# Patient Record
Sex: Male | Born: 1965 | Race: White | Hispanic: No | Marital: Single | Smoking: Never smoker
Health system: Southern US, Community
[De-identification: ages and names within clinical notes are randomized; demographics above are authoritative.]

## PROBLEM LIST (undated history)

## (undated) DIAGNOSIS — F909 Attention-deficit hyperactivity disorder, unspecified type: Secondary | ICD-10-CM

## (undated) DIAGNOSIS — F419 Anxiety disorder, unspecified: Secondary | ICD-10-CM

## (undated) DIAGNOSIS — L02419 Cutaneous abscess of limb, unspecified: Secondary | ICD-10-CM

## (undated) DIAGNOSIS — E291 Testicular hypofunction: Secondary | ICD-10-CM

## (undated) DIAGNOSIS — L989 Disorder of the skin and subcutaneous tissue, unspecified: Secondary | ICD-10-CM

## (undated) DIAGNOSIS — G4700 Insomnia, unspecified: Secondary | ICD-10-CM

## (undated) DIAGNOSIS — G4733 Obstructive sleep apnea (adult) (pediatric): Secondary | ICD-10-CM

## (undated) DIAGNOSIS — Z8279 Family history of other congenital malformations, deformations and chromosomal abnormalities: Secondary | ICD-10-CM

## (undated) DIAGNOSIS — R7301 Impaired fasting glucose: Secondary | ICD-10-CM

## (undated) DIAGNOSIS — I10 Essential (primary) hypertension: Secondary | ICD-10-CM

## (undated) DIAGNOSIS — F329 Major depressive disorder, single episode, unspecified: Secondary | ICD-10-CM

## (undated) DIAGNOSIS — E782 Mixed hyperlipidemia: Secondary | ICD-10-CM

## (undated) DIAGNOSIS — F32A Depression, unspecified: Secondary | ICD-10-CM

## (undated) DIAGNOSIS — L03119 Cellulitis of unspecified part of limb: Secondary | ICD-10-CM

## (undated) HISTORY — DX: Family history of other congenital malformations, deformations and chromosomal abnormalities: Z82.79

## (undated) HISTORY — DX: Obstructive sleep apnea (adult) (pediatric): G47.33

## (undated) HISTORY — DX: Disorder of the skin and subcutaneous tissue, unspecified: L98.9

## (undated) HISTORY — DX: Cutaneous abscess of limb, unspecified: L02.419

## (undated) HISTORY — DX: Impaired fasting glucose: R73.01

## (undated) HISTORY — DX: Cellulitis of unspecified part of limb: L03.119

## (undated) HISTORY — DX: Attention-deficit hyperactivity disorder, unspecified type: F90.9

## (undated) HISTORY — DX: Depression, unspecified: F32.A

## (undated) HISTORY — DX: Anxiety disorder, unspecified: F41.9

## (undated) HISTORY — DX: Insomnia, unspecified: G47.00

## (undated) HISTORY — PX: WISDOM TOOTH EXTRACTION: SHX21

## (undated) HISTORY — DX: Mixed hyperlipidemia: E78.2

## (undated) HISTORY — DX: Testicular hypofunction: E29.1

## (undated) HISTORY — DX: Major depressive disorder, single episode, unspecified: F32.9

---

## 2015-03-15 DIAGNOSIS — I10 Essential (primary) hypertension: Secondary | ICD-10-CM

## 2015-03-15 DIAGNOSIS — L02419 Cutaneous abscess of limb, unspecified: Secondary | ICD-10-CM

## 2015-03-15 HISTORY — DX: Essential (primary) hypertension: I10

## 2015-03-15 HISTORY — DX: Cutaneous abscess of limb, unspecified: L02.419

## 2015-03-31 ENCOUNTER — Emergency Department (HOSPITAL_BASED_OUTPATIENT_CLINIC_OR_DEPARTMENT_OTHER)
Admission: EM | Admit: 2015-03-31 | Discharge: 2015-03-31 | Disposition: A | Discharge status: Home / Self Care | Payer: BLUE CROSS/BLUE SHIELD | Attending: Emergency Medicine | Admitting: Emergency Medicine

## 2015-03-31 ENCOUNTER — Encounter (HOSPITAL_BASED_OUTPATIENT_CLINIC_OR_DEPARTMENT_OTHER): Payer: Self-pay | Admitting: *Deleted

## 2015-03-31 DIAGNOSIS — L02415 Cutaneous abscess of right lower limb: Secondary | ICD-10-CM

## 2015-03-31 DIAGNOSIS — I10 Essential (primary) hypertension: Secondary | ICD-10-CM | POA: Diagnosis not present

## 2015-03-31 DIAGNOSIS — Z792 Long term (current) use of antibiotics: Secondary | ICD-10-CM | POA: Diagnosis not present

## 2015-03-31 DIAGNOSIS — Z79899 Other long term (current) drug therapy: Secondary | ICD-10-CM | POA: Diagnosis not present

## 2015-03-31 DIAGNOSIS — L03115 Cellulitis of right lower limb: Secondary | ICD-10-CM | POA: Diagnosis present

## 2015-03-31 HISTORY — DX: Essential (primary) hypertension: I10

## 2015-03-31 MED ORDER — DOXYCYCLINE HYCLATE 100 MG PO CAPS: 100.0000 mg | ORAL_CAPSULE | Freq: Two times a day (BID) | ORAL | Status: DC

## 2015-03-31 MED ORDER — TRAMADOL HCL 50 MG PO TABS: 50.0000 mg | ORAL_TABLET | Freq: Four times a day (QID) | ORAL | Status: DC | PRN

## 2015-03-31 MED ORDER — DICLOFENAC SODIUM 50 MG PO TBEC
50.0000 mg | DELAYED_RELEASE_TABLET | Freq: Once | ORAL | Status: DC
  Filled 2015-03-31: qty 1

## 2015-03-31 MED ORDER — IBUPROFEN 400 MG PO TABS
600.0000 mg | ORAL_TABLET | Freq: Once | ORAL | Status: AC
  Administered 2015-03-31: 600 mg via ORAL
  Filled 2015-03-31: qty 1

## 2015-03-31 MED ORDER — LIDOCAINE-EPINEPHRINE 1 %-1:100000 IJ SOLN
20.0000 mL | Freq: Once | INTRAMUSCULAR | Status: AC
  Administered 2015-03-31: 20 mL via INTRADERMAL

## 2015-03-31 MED ORDER — DOXYCYCLINE HYCLATE 100 MG PO TABS
100.0000 mg | ORAL_TABLET | Freq: Once | ORAL | Status: AC
  Administered 2015-03-31: 100 mg via ORAL
  Filled 2015-03-31: qty 1

## 2015-03-31 MED ORDER — LIDOCAINE-EPINEPHRINE 1 %-1:100000 IJ SOLN
INTRAMUSCULAR | Status: AC
  Filled 2015-03-31: qty 1

## 2015-03-31 NOTE — Discharge Instructions (Signed)
Abscess Follow-up with a primary care provider regarding her blood pressure and recurrent abscess. Take doxycycline as prescribed. An abscess is an infected area that contains a collection of pus and debris.It can occur in almost any part of the body. An abscess is also known as a furuncle or boil. CAUSES  An abscess occurs when tissue gets infected. This can occur from blockage of oil or sweat glands, infection of hair follicles, or a minor injury to the skin. As the body tries to fight the infection, pus collects in the area and creates pressure under the skin. This pressure causes pain. People with weakened immune systems have difficulty fighting infections and get certain abscesses more often.  SYMPTOMS Usually an abscess develops on the skin and becomes a painful mass that is red, warm, and tender. If the abscess forms under the skin, you may feel a moveable soft area under the skin. Some abscesses break open (rupture) on their own, but most will continue to get worse without care. The infection can spread deeper into the body and eventually into the bloodstream, causing you to feel ill.  DIAGNOSIS  Your caregiver will take your medical history and perform a physical exam. A sample of fluid may also be taken from the abscess to determine what is causing your infection. TREATMENT  Your caregiver may prescribe antibiotic medicines to fight the infection. However, taking antibiotics alone usually does not cure an abscess. Your caregiver may need to make a small cut (incision) in the abscess to drain the pus. In some cases, gauze is packed into the abscess to reduce pain and to continue draining the area. HOME CARE INSTRUCTIONS   Only take over-the-counter or prescription medicines for pain, discomfort, or fever as directed by your caregiver.  If you were prescribed antibiotics, take them as directed. Finish them even if you start to feel better.  If gauze is used, follow your caregiver's  directions for changing the gauze.  To avoid spreading the infection:  Keep your draining abscess covered with a bandage.  Wash your hands well.  Do not share personal care items, towels, or whirlpools with others.  Avoid skin contact with others.  Keep your skin and clothes clean around the abscess.  Keep all follow-up appointments as directed by your caregiver. SEEK MEDICAL CARE IF:   You have increased pain, swelling, redness, fluid drainage, or bleeding.  You have muscle aches, chills, or a general ill feeling.  You have a fever. MAKE SURE YOU:   Understand these instructions.  Will watch your condition.  Will get help right away if you are not doing well or get worse.   This information is not intended to replace advice given to you by your health care provider. Make sure you discuss any questions you have with your health care provider.   Document Released: 02/07/2005 Document Revised: 10/30/2011 Document Reviewed: 07/13/2011 Elsevier Interactive Patient Education 2016 Elsevier Inc.  Incision and Drainage Incision and drainage is a procedure in which a sac-like structure (cystic structure) is opened and drained. The area to be drained usually contains material such as pus, fluid, or blood.  LET YOUR CAREGIVER KNOW ABOUT:   Allergies to medicine.  Medicines taken, including vitamins, herbs, eyedrops, over-the-counter medicines, and creams.  Use of steroids (by mouth or creams).  Previous problems with anesthetics or numbing medicines.  History of bleeding problems or blood clots.  Previous surgery.  Other health problems, including diabetes and kidney problems.  Possibility of pregnancy, if  this applies. RISKS AND COMPLICATIONS  Pain.  Bleeding.  Scarring.  Infection. BEFORE THE PROCEDURE  You may need to have an ultrasound or other imaging tests to see how large or deep your cystic structure is. Blood tests may also be used to determine if you  have an infection or how severe the infection is. You may need to have a tetanus shot. PROCEDURE  The affected area is cleaned with a cleaning fluid. The cyst area will then be numbed with a medicine (local anesthetic). A small incision will be made in the cystic structure. A syringe or catheter may be used to drain the contents of the cystic structure, or the contents may be squeezed out. The area will then be flushed with a cleansing solution. After cleansing the area, it is often gently packed with a gauze or another wound dressing. Once it is packed, it will be covered with gauze and tape or some other type of wound dressing. AFTER THE PROCEDURE   Often, you will be allowed to go home right after the procedure.  You may be given antibiotic medicine to prevent or heal an infection.  If the area was packed with gauze or some other wound dressing, you will likely need to come back in 1 to 2 days to get it removed.  The area should heal in about 14 days.   This information is not intended to replace advice given to you by your health care provider. Make sure you discuss any questions you have with your health care provider.   Document Released: 10/24/2000 Document Revised: 10/30/2011 Document Reviewed: 06/25/2011 Elsevier Interactive Patient Education [employer] Elsevier Inc.

## 2015-03-31 NOTE — ED Provider Notes (Signed)
CSN: [id number]     Arrival date & time 03/31/15  1651 History   First MD Initiated Contact with Patient 03/31/15 1704     Chief Complaint  Patient presents with  . Cellulitis     (Consider location/radiation/quality/duration/timing/severity/associated sxs/prior Treatment) The history is provided by the patient. No language interpreter was used.   Mr. Guy Garner is a 49 year old male with a history of recently diagnosed hypertension x1 week (on amlodipine) who presents for right leg abscess that began one month ago. He states he was seen at an urgent care and started on Bactrim for 7 days at that time. He states that the abscess resolved but that there was a knot there that he was concerned about so had the wound recheck. They stated it was not infected at that time. They noticed that his blood pressure was high again and started him on amlodipine. The knot on his right leg became infected again, and he marked the area and return to urgent care 4 days ago and they put him on Bactrim again for 10 days. He states that he has been applying warm compresses to the area. No previous history of abscesses.  He denies any fever, chills.  Past Medical History  Diagnosis Date  . Hypertension    History reviewed. No pertinent past surgical history. No family history on file. Social History  Substance Use Topics  . Smoking status: Never Smoker   . Smokeless tobacco: None  . Alcohol Use: No    Review of Systems  Constitutional: Negative for fever and chills.  Skin: Positive for wound.      Allergies  Review of patient's allergies indicates no known allergies.  Home Medications   Prior to Admission medications   Medication Sig Start Date End Date Taking? Authorizing Provider  amLODipine (NORVASC) 5 MG tablet Take 5 mg by mouth daily.   Yes Historical Provider, MD  sulfamethoxazole-trimethoprim (BACTRIM DS,SEPTRA DS) 800-160 MG tablet Take 1 tablet by mouth 2 (two) times daily.   Yes  Historical Provider, MD  doxycycline (VIBRAMYCIN) 100 MG capsule Take 1 capsule (100 mg total) by mouth 2 (two) times daily. 03/31/15   Keegan Bensch Patel-Mills, PA-C  traMADol (ULTRAM) 50 MG tablet Take 1 tablet (50 mg total) by mouth every 6 (six) hours as needed. 03/31/15   Ivah Girardot Patel-Mills, PA-C   BP 131/94 mmHg  Pulse 82  Temp(Src) 97.5 F (36.4 C) (Oral)  Resp 18  Ht  (1.854 m)  Wt 243 lb (110.224 kg)  BMI 32.07 kg/m2  SpO2 93% Physical Exam  Constitutional: He is oriented to person, place, and time. He appears well-developed and well-nourished.  HENT:  Head: Normocephalic and atraumatic.  Eyes: Conjunctivae are normal.  Neck: Neck supple.  Cardiovascular: Normal rate.   Pulmonary/Chest: Effort normal.  Musculoskeletal: Normal range of motion.  Neurological: He is alert and oriented to person, place, and time.  Skin: Skin is warm and dry.  Right lower extremity: Raised erythematous 3 cm round area above the mid tibia with 8 cm of surrounding erythema. The wound is warm, tender, and fluctuant with purulent yellow and clear drainage.  Psychiatric: He has a normal mood and affect.  Nursing note and vitals reviewed.   ED Course  Procedures (including critical care time) INCISION AND DRAINAGE Performed by: Catha Gosselin Consent: Verbal consent obtained. Risks and benefits: risks, benefits and alternatives were discussed Type: abscess Body area: right mid lower extremity above tibia Anesthesia: local infiltration Incision was made with a  scalpel. Local anesthetic: lidocaine 1% with epinephrine Anesthetic total: 2 ml Complexity: simple Blunt dissection to break up loculations Drainage: purulent Drainage amount: 3mL Packing material: 1/4 in iodoform gauze Patient tolerance: Patient tolerated the procedure well with no immediate complications. procedure was done in a bloodless field.    Labs Review Labs Reviewed  CULTURE, ROUTINE-ABSCESS    Imaging Review No  results found. I have personally reviewed and evaluated these lab results as part of my medical decision-making.   EKG Interpretation None      MDM   Final diagnoses:  Abscess of right lower leg  Patient presents for recurrent abscess above the mid right tibia. He is afebrile but hypertensive. He was recently started on amlodipine one week ago for high blood pressure. The abscess will need to be drained. He has been on Bactrim 2 for his leg wound which she states is not working. He also stated that he took doses as recommended and has been taking them for the past 4 days.  I will change his antibiotic to doxycycline. I discussed return precautions with the patient as well as follow-up. He verbally agrees with the plan. Medications  doxycycline (VIBRA-TABS) tablet 100 mg (100 mg Oral Given 03/31/15 1759)  lidocaine-EPINEPHrine (XYLOCAINE W/EPI) 1 %-1:100000 (with pres) injection 20 mL (20 mLs Intradermal Given 03/31/15 1746)  lidocaine-EPINEPHrine (XYLOCAINE W/EPI) 1 %-1:100000 (with pres) injection (  Duplicate 03/31/15 1746)  ibuprofen (ADVIL,MOTRIN) tablet 600 mg (600 mg Oral Given 03/31/15 1818)   Filed Vitals:   03/31/15 1838  BP: 131/94  Pulse: 82  Temp: 97.5 F (36.4 C)  Resp: [address]18       Adelyne Marchese Patel-Mills, PA-C 03/31/15 1919  Lyndal Pulleyaniel Knott, MD 03/31/15 1924

## 2015-03-31 NOTE — ED Notes (Signed)
Pt here for continued evaluation of cellulitis. Pt states has been treated at urgent care for several weeks on antibiotics without relief.

## 2015-04-01 NOTE — ED Notes (Signed)
Guy Garner called to state that he developed dizziness, nausea and dry heaves today after taking his second dose of Doxycycline.  Chart reviewed with Dr. Juleen ChinaKohut.  OK to stop the Doxycycline and restart the Bactrim that he was started on four days ago.  Encouraged to return if symptoms worsen, runs a fever or other concerns.  Patient verbalized understanding.

## 2015-04-03 LAB — CULTURE, ROUTINE-ABSCESS

## 2015-04-04 ENCOUNTER — Telehealth (HOSPITAL_BASED_OUTPATIENT_CLINIC_OR_DEPARTMENT_OTHER): Payer: Self-pay | Admitting: Emergency Medicine

## 2015-04-04 NOTE — Telephone Encounter (Signed)
Post ED Visit - Positive Culture Follow-up  Culture report reviewed by antimicrobial stewardship pharmacist:  []  Enzo BiNathan Batchelder, Pharm.D. []  Celedonio MiyamotoJeremy Frens, Pharm.D., BCPS []  Garvin FilaMike Maccia, Pharm.D. []  Georgina PillionElizabeth Martin, Pharm.D., BCPS []  [place]Minh Pham, [address]Pharm[address]D., BCPS, AAHIVP []  Estella HuskMichelle Turner, Pharm.D., BCPS, AAHIVP []  Tennis Mustassie Stewart, Pharm.D. [x]  Rob Oswaldo DoneVincent, [address]Pharm[address]  Positive abcess culture staph aureus Treated with bactrim DS and doxycycline, organism sensitive to the same and no further patient follow-up is required at this time.  Guy MullMiller, Guy Garner 04/04/2015, 11:49 AM

## 2015-04-10 ENCOUNTER — Encounter: Payer: Self-pay | Admitting: Family Medicine

## 2015-04-11 ENCOUNTER — Encounter: Payer: Self-pay | Admitting: Family Medicine

## 2015-04-11 ENCOUNTER — Ambulatory Visit (INDEPENDENT_AMBULATORY_CARE_PROVIDER_SITE_OTHER): Payer: BLUE CROSS/BLUE SHIELD | Admitting: Family Medicine

## 2015-04-11 VITALS — BP 138/96 | HR 112 | Temp 98.6°F | Resp 16 | Ht 71.75 in | Wt 247.0 lb

## 2015-04-11 DIAGNOSIS — Z23 Encounter for immunization: Secondary | ICD-10-CM | POA: Diagnosis not present

## 2015-04-11 DIAGNOSIS — L02419 Cutaneous abscess of limb, unspecified: Secondary | ICD-10-CM

## 2015-04-11 DIAGNOSIS — I10 Essential (primary) hypertension: Secondary | ICD-10-CM | POA: Diagnosis not present

## 2015-04-11 DIAGNOSIS — L03119 Cellulitis of unspecified part of limb: Secondary | ICD-10-CM | POA: Diagnosis not present

## 2015-04-11 MED ORDER — AMLODIPINE BESYLATE 10 MG PO TABS: 10.0000 mg | ORAL_TABLET | Freq: Every day | ORAL | Status: DC

## 2015-04-11 MED ORDER — CEPHALEXIN 500 MG PO CAPS: 500.0000 mg | ORAL_CAPSULE | Freq: Three times a day (TID) | ORAL | Status: DC

## 2015-04-11 NOTE — Progress Notes (Signed)
Pre visit review using our clinic review tool, if applicable. No additional management support is needed unless otherwise documented below in the visit note. 

## 2015-04-11 NOTE — Patient Instructions (Addendum)
Buy an upper arm bp cuff and check your bp and heart rate 1-2 times per day (quiet room x 5 min) and record these and return for review of them with me in 2 wks.  Normal bp is <140/90 but we'll be shooting for avg bp of <130 on top and <85 on bottom.  Avoid OTC decongestants (sudafed, phenylephrine).  Limit coffee to ??? Cups a day.   Change your dressing once daily (wet-to-dry gauze).

## 2015-04-11 NOTE — Progress Notes (Signed)
Office Note 04/11/2015  CC:  Chief Complaint  Patient presents with  . Establish Care  . Cellulitis    right leg, seen at ER     HPI:  Guy Garner is a 49 y.o. White male who is here to establish care and discuss elevated bp as well as recent R LL cellulitis w/small abscess recently.  Patient's most recent primary MD: [facility] [place] Old records in [facility] EMR and [facility] in [place] recently were reviewed prior to or during today's visit.  Has adult ADHD, [facility] Mar[facility]ue [facility]Amy [facility].   Has history of mild bp elevation since being put on adderall but admittedly he never had bp checked with any regularity prior to being on adderall.  Describes diet as being suboptimal and does not exercise regularly.  +FH HTN. Although he doesn't take sudafed regularly he did take a dose of short acting sudafed last night.   Was put on amlodipine about 2 wks ago for having elevated bp at the clinic visit when he was first evaluated for his leg cellulitis.  Has been having HA's lately frontal/temporal area.  Lots of stress lately plus brother had CVA recently.  Has no excessive urination or thirst.  Has had recent right tibia cellulitis/abscess; recently I&D's at [facility] 03/31/15: recently finished bactrim x total 7d course but this was an interrupted course.  The discomfort of his leg is gradually improving.  He did not tolerate a trial of doxycycline.  Past Medical History  Diagnosis Date  . Hypertension 03/2015  . Cellulitis and abscess of leg 03/2015    Right; I&D in ED 03/31/15 ([facility])  . Adult ADHD     Managed by Dr. Elisabeth MostStevenson    History reviewed. No pertinent past surgical history.  Family History  Problem Relation Age of Onset  . Hypothyroidism Mother   . Dementia Mother   . Heart attack Father   . Diabetes Father   . Hyperlipidemia Father   . Heart disease Father   . Hypertension Father   . Diabetes Brother   .  Stroke Brother   . Diabetes Maternal Grandmother   . Arthritis Paternal Grandmother   . Colon cancer Paternal Grandmother   . Stroke Paternal Grandfather     Social History   Social History  . Marital Status: Single    Spouse Name: N/A  . Number of Children: N/A  . Years of Education: N/A   Occupational History  . Not on file.   Social History Main Topics  . Smoking status: Never Smoker   . Smokeless tobacco: Never Used  . Alcohol Use: Yes  . Drug Use: No  . Sexual Activity: Not on file   Other Topics Concern  . Not on file   Social History Narrative   Divorced, 1 daughter.   Occupation: Hairdresser/stylist.   No T/A/Ds.    Outpatient Encounter Prescriptions as of 04/11/2015  Medication Sig  . ADDERALL XR 20 MG 24 hr capsule Take 1 tablet by mouth daily as needed.  . ADDERALL XR 30 MG 24 hr capsule Take 1 tablet by mouth daily.  Marland Kitchen. loratadine (CLARITIN) 10 MG tablet Take 10 mg by mouth daily.  . [DISCONTINUED] amLODipine (NORVASC) 5 MG tablet Take 5 mg by mouth daily.  Marland Kitchen. amLODipine (NORVASC) 10 MG tablet Take 1 tablet (10 mg total) by mouth daily.  . cephALEXin (KEFLEX) 500 MG capsule Take 1 capsule (500 mg total) by mouth  3 (three) times daily.  . [DISCONTINUED] doxycycline (VIBRAMYCIN) 100 MG capsule Take 1 capsule (100 mg total) by mouth 2 (two) times daily. (Patient not taking: Reported on 04/11/2015)  . [DISCONTINUED] sulfamethoxazole-trimethoprim (BACTRIM DS,SEPTRA DS) 800-160 MG tablet Take 1 tablet by mouth 2 (two) times daily.  . [DISCONTINUED] traMADol (ULTRAM) 50 MG tablet Take 1 tablet (50 mg total) by mouth every 6 (six) hours as needed. (Patient not taking: Reported on 04/11/2015)   No facility-administered encounter medications on file as of 04/11/2015.    Allergies  Allergen Reactions  . Doxycycline Nausea And Vomiting    ROS Review of Systems  Constitutional: Negative for fever and fatigue.  HENT: Negative for congestion and sore throat.    Eyes: Negative for visual disturbance.  Respiratory: Negative for cough.   Cardiovascular: Negative for chest pain.  Gastrointestinal: Negative for nausea and abdominal pain.  Genitourinary: Negative for dysuria.  Musculoskeletal: Negative for back pain and joint swelling.  Skin: Positive for wound (see HPI). Negative for rash.  Neurological: Positive for headaches (see hpi). Negative for tremors, weakness and light-headedness.  Hematological: Negative for adenopathy.    PE; Blood pressure 138/96, pulse 112, temperature 98.6 F (37 C), temperature source Oral, resp. rate 16, height 5' 11.75" (1.822 m), weight 247 lb (112.038 kg), SpO2 98 %. BP recheck 138/96 Gen: Alert, well appearing.  Patient is oriented to person, place, time, and situation. AFFECT: pleasant, lucid thought and speech. ZOX:WRUE: no injection, icteris, swelling, or exudate.  EOMI, PERRLA. Mouth: lips without lesion/swelling.  Oral mucosa pink and moist. Oropharynx without erythema, exudate, or swelling.  Neck: supple/nontender.  No LAD, mass, or TM.  Carotid pulses 2+ bilaterally, without bruits. CV: RRR, no m/r/g.   LUNGS: CTA bilat, nonlabored resps, good aeration in all lung fields. ABD: soft, NT/ND EXT: 1+ pitting edema bilat in LE's below knees (chronic per pt--PRIOR to taking amlodipine) SKIN: R pretibial region at about mid tibia level is an oval superficial ulceration at the site of his I&D, with greyish exudate in the wound bed, pink-hued skin surrounding wound.  No tenderness, erythema, active exudate, or streaking.    Pertinent labs:  03/27/16: cbc and bmet wnl--to be scanned into chart Wound clx 03/31/15 showed MSSA  ASSESSMENT AND PLAN:   New pt; obtain old records.  1) HTN; suspect he has had this prior to being on adderall, but I advised him to discuss alternative ADHD meds with Dr. Elisabeth Most.   Increase amlodipine to  qd. Instructions: Buy an upper arm bp cuff and check your bp and heart rate  1-2 times per day (quiet room x 5 min) and record these and return for review of them with me in 2 wks.  Normal bp is <140/90 but we'll be shooting for avg bp of <130 on top and <85 on bottom.  Avoid OTC decongestants (sudafed, phenylephrine).  2) R lower leg/pretibial abscess: s/p I&D, MSSA, cellulitis looks to have cleared. I want to give him another 7d of cephalexin since his antibiotic course was an "interrupted" one. Discussed daily wet-to-dry dressing changes, avoid antibiotic ointments now, monitor for changes, call for problems.  An After Visit Summary was printed and given to the patient.  Return in about 2 weeks (around 04/25/2015) for f/u HTN and leg wound.

## 2015-04-13 ENCOUNTER — Encounter: Payer: Self-pay | Admitting: Family Medicine

## 2015-04-14 ENCOUNTER — Other Ambulatory Visit: Payer: Self-pay | Admitting: Family Medicine

## 2015-04-14 ENCOUNTER — Encounter: Payer: Self-pay | Admitting: Family Medicine

## 2015-04-14 MED ORDER — AMLODIPINE BESY-BENAZEPRIL HCL 5-10 MG PO CAPS: 1.0000 | ORAL_CAPSULE | Freq: Every day | ORAL | Status: DC

## 2015-04-25 ENCOUNTER — Ambulatory Visit (INDEPENDENT_AMBULATORY_CARE_PROVIDER_SITE_OTHER): Payer: BLUE CROSS/BLUE SHIELD | Admitting: Family Medicine

## 2015-04-25 ENCOUNTER — Encounter: Payer: Self-pay | Admitting: Family Medicine

## 2015-04-25 VITALS — BP 142/86 | Temp 97.9°F | Ht 73.0 in | Wt 252.0 lb

## 2015-04-25 DIAGNOSIS — L03119 Cellulitis of unspecified part of limb: Secondary | ICD-10-CM | POA: Diagnosis not present

## 2015-04-25 DIAGNOSIS — L02419 Cutaneous abscess of limb, unspecified: Secondary | ICD-10-CM

## 2015-04-25 DIAGNOSIS — I10 Essential (primary) hypertension: Secondary | ICD-10-CM

## 2015-04-25 NOTE — Progress Notes (Signed)
OFFICE NOTE  04/25/2015  CC: No chief complaint on file.    HPI: Patient is a 49 y.o. Caucasian male who is here for 2 week f/u HTN and R LL pretibial cellulitis/abscess. Finished 7 d course of cephalexin that I gave him last visit. Since last visit I also changed him from 10mg  amlodipine to lotrel 5-10 qd.  Swelling in LE's not much of an issue at all. Home bp monitoring: avg 130s over 70s, HR avg 80. Leg feels great: no pain.  Just itching.  Has a dry scab and doesn't have to cover it anymore.  Goes to see ADHD MD today.  He will discuss non-stimulant options.   Pertinent PMH:  Past medical, surgical, social, and family history reviewed and no changes are noted since last office visit.  MEDS:  Outpatient Prescriptions Prior to Visit  Medication Sig Dispense Refill  . ADDERALL XR 20 MG 24 hr capsule Take 1 tablet by mouth daily as needed.  0  . ADDERALL XR 30 MG 24 hr capsule Take 1 tablet by mouth daily.  0  . amLODipine-benazepril (LOTREL) 5-10 MG capsule Take 1 capsule by mouth daily. 30 capsule 6  . loratadine (CLARITIN) 10 MG tablet Take 10 mg by mouth daily.    . cephALEXin (KEFLEX) 500 MG capsule Take 1 capsule (500 mg total) by mouth 3 (three) times daily. (Patient not taking: Reported on 04/25/2015) 21 capsule 0   No facility-administered medications prior to visit.    PE: Blood pressure 142/86, temperature 97.9 F (36.6 C), temperature source Oral, height 6\' 1"  (1.854 m), weight 252 lb (114.306 kg). Gen: Alert, well appearing.  Patient is oriented to person, place, time, and situation. CV: RRR, no m/r/g.   LUNGS: CTA bilat, nonlabored resps, good aeration in all lung fields. EXT: no clubbing or cyanosis.  1+ pitting edema both pretibial regions. R pretibial region with 2 cm oblong/oval scab without surrounding erythema, swelling, fluctuation, or tenderness.  IMPRESSION AND PLAN:  1) Essential HTN; good control now.  Continue current med. Exercise and diet  importance reiterated.  2) R pretibial cellulitis, recent abscess: healing well, no further abx. Signs/symptoms to call or return for were reviewed and pt expressed understanding.  An After Visit Summary was printed and given to the patient.   FOLLOW UP: 3 mo f/u HTN

## 2015-06-15 ENCOUNTER — Encounter: Payer: Self-pay | Admitting: Family Medicine

## 2015-06-15 ENCOUNTER — Other Ambulatory Visit: Payer: Self-pay | Admitting: Family Medicine

## 2015-06-15 MED ORDER — AMOXICILLIN 875 MG PO TABS: 875.0000 mg | ORAL_TABLET | Freq: Two times a day (BID) | ORAL | Status: AC

## 2015-06-15 NOTE — Telephone Encounter (Signed)
I left message on pts cell phone this morning advising him that he would need to be seen in office to be treated. Please advise. Thanks.

## 2015-06-15 NOTE — Telephone Encounter (Signed)
Please advise. Thanks.  

## 2015-07-13 DIAGNOSIS — R7301 Impaired fasting glucose: Secondary | ICD-10-CM

## 2015-07-13 DIAGNOSIS — E782 Mixed hyperlipidemia: Secondary | ICD-10-CM

## 2015-07-13 HISTORY — DX: Impaired fasting glucose: R73.01

## 2015-07-13 HISTORY — DX: Mixed hyperlipidemia: E78.2

## 2015-07-25 ENCOUNTER — Ambulatory Visit: Payer: BLUE CROSS/BLUE SHIELD | Admitting: Family Medicine

## 2015-08-01 ENCOUNTER — Ambulatory Visit (INDEPENDENT_AMBULATORY_CARE_PROVIDER_SITE_OTHER): Payer: BLUE CROSS/BLUE SHIELD | Admitting: Family Medicine

## 2015-08-01 ENCOUNTER — Encounter: Payer: Self-pay | Admitting: Family Medicine

## 2015-08-01 VITALS — BP 135/88 | HR 83 | Temp 97.5°F | Ht 73.0 in | Wt 250.8 lb

## 2015-08-01 DIAGNOSIS — I10 Essential (primary) hypertension: Secondary | ICD-10-CM

## 2015-08-01 DIAGNOSIS — R5382 Chronic fatigue, unspecified: Secondary | ICD-10-CM | POA: Diagnosis not present

## 2015-08-01 LAB — CBC WITH DIFFERENTIAL/PLATELET
BASOS ABS: 0 10*3/uL (ref 0.0–0.1)
BASOS PCT: 0.5 % (ref 0.0–3.0)
EOS ABS: 0.1 10*3/uL (ref 0.0–0.7)
Eosinophils Relative: 2.3 % (ref 0.0–5.0)
HEMATOCRIT: 48.2 % (ref 39.0–52.0)
HEMOGLOBIN: 16.5 g/dL (ref 13.0–17.0)
LYMPHS PCT: 28.3 % (ref 12.0–46.0)
Lymphs Abs: 1.6 10*3/uL (ref 0.7–4.0)
MCHC: 34.2 g/dL (ref 30.0–36.0)
MCV: 91.6 fl (ref 78.0–100.0)
Monocytes Absolute: 0.3 10*3/uL (ref 0.1–1.0)
Monocytes Relative: 6 % (ref 3.0–12.0)
NEUTROS ABS: 3.6 10*3/uL (ref 1.4–7.7)
Neutrophils Relative %: 62.9 % (ref 43.0–77.0)
PLATELETS: 263 10*3/uL (ref 150.0–400.0)
RBC: 5.26 Mil/uL (ref 4.22–5.81)
RDW: 13.2 % (ref 11.5–15.5)
WBC: 5.7 10*3/uL (ref 4.0–10.5)

## 2015-08-01 LAB — COMPREHENSIVE METABOLIC PANEL
ALBUMIN: 4.5 g/dL (ref 3.5–5.2)
ALK PHOS: 40 U/L (ref 39–117)
ALT: 28 U/L (ref 0–53)
AST: 14 U/L (ref 0–37)
BILIRUBIN TOTAL: 0.7 mg/dL (ref 0.2–1.2)
BUN: 10 mg/dL (ref 6–23)
CALCIUM: 9.9 mg/dL (ref 8.4–10.5)
CHLORIDE: 105 meq/L (ref 96–112)
CO2: 25 mEq/L (ref 19–32)
CREATININE: 0.87 mg/dL (ref 0.40–1.50)
GFR: 98.81 mL/min (ref >60.00)
Glucose, Bld: 105 mg/dL — ABNORMAL HIGH (ref 70–99)
Potassium: 4.1 mEq/L (ref 3.5–5.1)
SODIUM: 138 meq/L (ref 135–145)
TOTAL PROTEIN: 6.9 g/dL (ref 6.0–8.3)

## 2015-08-01 LAB — LIPID PANEL
CHOLESTEROL: 228 mg/dL — AB (ref 0–200)
HDL: 53.8 mg/dL (ref >39.00)
LDL CALC: 139 mg/dL — AB (ref 0–99)
NonHDL: 174.32
TRIGLYCERIDES: 179 mg/dL — AB (ref 0.0–149.0)
Total CHOL/HDL Ratio: 4
VLDL: 35.8 mg/dL (ref 0.0–40.0)

## 2015-08-01 LAB — TSH: TSH: 0.66 u[IU]/mL (ref 0.35–4.50)

## 2015-08-01 NOTE — Progress Notes (Signed)
OFFICE VISIT  08/01/2015   CC:  Chief Complaint  Patient presents with  . Hypertension   HPI:    Patient is a 50 y.o. Caucasian male who presents for 3 mo f/u HTN, also here for fasting labs. Home bp monitoring : 120s over 80s.  HR 75-80.  Says he is tired all the time x > 6 mo: gets 7-8 hours of sleep per night and it is restful sleep.  He snores.  No known hx of apneic spells in sleep.  Says he is more tired in body than he is "sleepy" tired. Has a lot of stress with life lately.  Appetite is good, no abnl wt loss.  He does not do any exercise but recalls feeling better when he did exercise regularly.  NO areas of pain.   No HA's.  He does have chronic mild nasal congestion that has recently been improved on flonase. Still with some PND and coughing, particularly in mornings.  No wheezing or ST.   Past Medical History  Diagnosis Date  . Hypertension 03/2015  . Cellulitis and abscess of leg 03/2015    Right; I&D in ED 03/31/15 (MSSA)  . Adult ADHD     Managed by Dr. Elisabeth MostStevenson  . Anxiety and depression     wellbutrin and citalopram in past  . Insomnia     ambien in past    No past surgical history on file.  Outpatient Prescriptions Prior to Visit  Medication Sig Dispense Refill  . ADDERALL XR 20 MG 24 hr capsule Take 1 tablet by mouth daily as needed.  0  . ADDERALL XR 30 MG 24 hr capsule Take 1 tablet by mouth daily.  0  . amLODipine-benazepril (LOTREL) 5-10 MG capsule Take 1 capsule by mouth daily. 30 capsule 6  . loratadine (CLARITIN) 10 MG tablet Take 10 mg by mouth daily.     No facility-administered medications prior to visit.    Allergies  Allergen Reactions  . Doxycycline Nausea And Vomiting    ROS As per HPI  PE: Blood pressure 135/88, pulse 83, temperature 97.5 F (36.4 C), temperature source Oral, height 6\' 1"  (1.854 m), weight 250 lb 12.8 oz (113.762 kg), SpO2 95 %. Gen: Alert, well appearing.  Patient is oriented to person, place, time, and  situation. ENT: Ears: EACs clear, normal epithelium.  TMs with good light reflex and landmarks bilaterally.  Eyes: no injection, icteris, swelling, or exudate.  EOMI, PERRLA. Nose: no drainage or turbinate edema/swelling.  No injection or focal lesion.  Mouth: lips without lesion/swelling.  Oral mucosa pink and moist.  Dentition intact and without obvious caries or gingival swelling.  Oropharynx without erythema, exudate, or swelling.  Neck - No masses or thyromegaly or limitation in range of motion CV: RRR, no m/r/g.   LUNGS: CTA bilat, nonlabored resps, good aeration in all lung fields. EXT: no clubbing, cyanosis, or edema.   LABS:  NONE  IMPRESSION AND PLAN:  1) Essential HTN; The current medical regimen is effective;  continue present plan and medications. Lytes/cr today. Screen for hyperlipidemia today, hepatic panel as well.  2) Chronic fatigue: likely life stress combined with lack of exercise. Will check CBC and TSH today.  An After Visit Summary was printed and given to the patient.  FOLLOW UP: Return in about 6 months (around 02/01/2016) for annual CPE (fasting).

## 2015-08-02 ENCOUNTER — Other Ambulatory Visit (INDEPENDENT_AMBULATORY_CARE_PROVIDER_SITE_OTHER): Payer: BLUE CROSS/BLUE SHIELD

## 2015-08-02 DIAGNOSIS — R7309 Other abnormal glucose: Secondary | ICD-10-CM | POA: Diagnosis not present

## 2015-08-02 LAB — HEMOGLOBIN A1C: HEMOGLOBIN A1C: 5.5 % (ref 4.6–6.5)

## 2015-08-03 ENCOUNTER — Encounter: Payer: Self-pay | Admitting: Family Medicine

## 2015-08-09 ENCOUNTER — Encounter: Payer: Self-pay | Admitting: Family Medicine

## 2015-08-10 NOTE — Telephone Encounter (Signed)
Please advise. Thanks.  

## 2015-11-21 ENCOUNTER — Other Ambulatory Visit: Payer: Self-pay | Admitting: Family Medicine

## 2015-11-21 MED ORDER — ENALAPRIL MALEATE 10 MG PO TABS: 10.0000 mg | ORAL_TABLET | Freq: Every day | ORAL | Status: DC

## 2015-11-28 ENCOUNTER — Other Ambulatory Visit: Payer: Self-pay | Admitting: Family Medicine

## 2016-01-09 DIAGNOSIS — F902 Attention-deficit hyperactivity disorder, combined type: Secondary | ICD-10-CM | POA: Diagnosis not present

## 2016-01-09 DIAGNOSIS — F419 Anxiety disorder, unspecified: Secondary | ICD-10-CM | POA: Diagnosis not present

## 2016-01-09 DIAGNOSIS — Z79899 Other long term (current) drug therapy: Secondary | ICD-10-CM | POA: Diagnosis not present

## 2016-01-09 DIAGNOSIS — I10 Essential (primary) hypertension: Secondary | ICD-10-CM | POA: Diagnosis not present

## 2016-06-26 DIAGNOSIS — I10 Essential (primary) hypertension: Secondary | ICD-10-CM | POA: Diagnosis not present

## 2016-06-26 DIAGNOSIS — F902 Attention-deficit hyperactivity disorder, combined type: Secondary | ICD-10-CM | POA: Diagnosis not present

## 2016-06-26 DIAGNOSIS — F419 Anxiety disorder, unspecified: Secondary | ICD-10-CM | POA: Diagnosis not present

## 2016-06-26 DIAGNOSIS — Z79899 Other long term (current) drug therapy: Secondary | ICD-10-CM | POA: Diagnosis not present

## 2016-07-09 ENCOUNTER — Other Ambulatory Visit: Payer: Self-pay | Admitting: Family Medicine

## 2016-07-09 DIAGNOSIS — L989 Disorder of the skin and subcutaneous tissue, unspecified: Secondary | ICD-10-CM

## 2016-07-12 DIAGNOSIS — L989 Disorder of the skin and subcutaneous tissue, unspecified: Secondary | ICD-10-CM

## 2016-07-12 HISTORY — DX: Disorder of the skin and subcutaneous tissue, unspecified: L98.9

## 2016-07-16 ENCOUNTER — Other Ambulatory Visit: Payer: Self-pay | Admitting: Family Medicine

## 2016-07-16 NOTE — Telephone Encounter (Signed)
[facility].  RF request for enalapril LOV: 08/01/15 Next ov: None Last written: 11/21/15 #30 w/ 6RF  Will send Rx for #30 w/ 0RF. Pt needs office visit for more refills. Mychart message sent.

## 2016-07-17 NOTE — Telephone Encounter (Signed)
Pt advised and voiced understanding.  Apt made for 08/06/16 at 3:30pm.

## 2016-07-17 NOTE — Telephone Encounter (Signed)
Left message for pt to call back  °

## 2016-07-30 DIAGNOSIS — L820 Inflamed seborrheic keratosis: Secondary | ICD-10-CM | POA: Diagnosis not present

## 2016-08-01 ENCOUNTER — Encounter: Payer: Self-pay | Admitting: Family Medicine

## 2016-08-06 ENCOUNTER — Ambulatory Visit (INDEPENDENT_AMBULATORY_CARE_PROVIDER_SITE_OTHER): Payer: BLUE CROSS/BLUE SHIELD | Admitting: Family Medicine

## 2016-08-06 ENCOUNTER — Encounter: Payer: Self-pay | Admitting: Family Medicine

## 2016-08-06 VITALS — BP 151/95 | HR 88 | Temp 97.9°F | Resp 16 | Ht 73.0 in | Wt 251.8 lb

## 2016-08-06 DIAGNOSIS — I10 Essential (primary) hypertension: Secondary | ICD-10-CM

## 2016-08-06 MED ORDER — ENALAPRIL MALEATE 10 MG PO TABS: ORAL_TABLET | ORAL | 7 refills | Status: DC

## 2016-08-06 NOTE — Progress Notes (Signed)
Pre visit review using our clinic review tool, if applicable. No additional management support is needed unless otherwise documented below in the visit note. 

## 2016-08-06 NOTE — Progress Notes (Signed)
OFFICE VISIT  08/06/2016   CC:  Chief Complaint  Patient presents with  . Follow-up    RCI,    HPI:    Patient is a 51 y.o. Caucasian male who presents for f/u HTN.  I last saw him 1 yr ago. Home bp monitoring: 130s/80s.  No cough, no LE swelling, no dizziness, no HAs. No low bp readings.  ADHD med recently changed to Mydayis by Dr. Elisabeth Most.   Past Medical History:  Diagnosis Date  . Adult ADHD    Managed by Dr. Elisabeth Most  . Anxiety and depression    wellbutrin and citalopram in past  . Cellulitis and abscess of leg 03/2015   Right; I&D in ED 03/31/15 (MSSA)  . Facial lesion 07/2016   irritated seb keratosis (Dr. Lupton)--cryotherapy performed.  . Hyperlipemia, mixed 07/2015   Mild (10 yr framingham risk was 4.5%)-no meds indicated.  . Hypertension 03/2015  . IFG (impaired fasting glucose) 07/2015   Gluc 105.  A1c was 5.5% at that time.  . Insomnia    ambien in past    History reviewed. No pertinent surgical history.  Outpatient Medications Prior to Visit  Medication Sig Dispense Refill  . fluticasone (FLONASE) 50 MCG/ACT nasal spray Place into both nostrils daily.    Marland Kitchen loratadine (CLARITIN) 10 MG tablet Take 10 mg by mouth daily.    Marland Kitchen PREVIDENT 5000 BOOSTER PLUS 1.1 % PSTE   3  . enalapril (VASOTEC) 10 MG tablet TAKE 1 TABLET (10 MG TOTAL) BY MOUTH DAILY. 30 tablet 0  . ADDERALL XR 20 MG 24 hr capsule Take 1 tablet by mouth daily as needed.  0  . ADDERALL XR 30 MG 24 hr capsule Take 1 tablet by mouth daily.  0   No facility-administered medications prior to visit.     Allergies  Allergen Reactions  . Doxycycline Nausea And Vomiting    ROS As per HPI  PE: Blood pressure (!) 151/95, pulse 88, temperature 97.9 F (36.6 C), temperature source Oral, resp. rate 16, height 6\' 1"  (1.854 m), weight 251 lb 12 oz (114.2 kg), SpO2 99 %. Gen: Alert, well appearing.  Patient is oriented to person, place, time, and situation. AFFECT: pleasant, lucid thought and  speech. CV: RRR, no m/r/g.   LUNGS: CTA bilat, nonlabored resps, good aeration in all lung fields. EXT: no clubbing, cyanosis, or edema.    LABS:  Lab Results  Component Value Date   TSH 0.66 08/01/2015   Lab Results  Component Value Date   WBC 5.7 08/01/2015   HGB 16.5 08/01/2015   HCT 48.2 08/01/2015   MCV 91.6 08/01/2015   PLT 263.0 08/01/2015   Lab Results  Component Value Date   CREATININE 0.87 08/01/2015   BUN 10 08/01/2015   NA 138 08/01/2015   K 4.1 08/01/2015   CL 105 08/01/2015   CO2 25 08/01/2015   Lab Results  Component Value Date   ALT 28 08/01/2015   AST 14 08/01/2015   ALKPHOS 40 08/01/2015   BILITOT 0.7 08/01/2015   Lab Results  Component Value Date   CHOL 228 (H) 08/01/2015   Lab Results  Component Value Date   HDL 53.80 08/01/2015   Lab Results  Component Value Date   LDLCALC 139 (H) 08/01/2015   Lab Results  Component Value Date   TRIG 179.0 (H) 08/01/2015   Lab Results  Component Value Date   CHOLHDL 4 08/01/2015   Lab Results  Component Value Date  HGBA1C 5.5 08/02/2015   IMPRESSION AND PLAN:  HTN: as per latest HTN management guidelines, his bp is still not ideally controlled. He does have a white coat component to his HTN. We discussed dose change today and he wanted to be careful with this and just go up by 5mg  enalapril at this time. He'll take 1 and 1/2 of the 10mg  enalapril once daily.  He'll continue home bp monitoring and let me know if bp not responding or other problem.  An After Visit Summary was printed and given to the patient.  FOLLOW UP: Return in about 6 months (around 02/06/2017) for annual CPE (fasting).  Signed:  Santiago BumpersPhil Donnis Phaneuf, MD           08/06/2016

## 2016-09-24 DIAGNOSIS — I10 Essential (primary) hypertension: Secondary | ICD-10-CM | POA: Diagnosis not present

## 2016-09-24 DIAGNOSIS — F902 Attention-deficit hyperactivity disorder, combined type: Secondary | ICD-10-CM | POA: Diagnosis not present

## 2016-09-24 DIAGNOSIS — F419 Anxiety disorder, unspecified: Secondary | ICD-10-CM | POA: Diagnosis not present

## 2016-09-24 DIAGNOSIS — Z79899 Other long term (current) drug therapy: Secondary | ICD-10-CM | POA: Diagnosis not present

## 2016-12-24 DIAGNOSIS — F902 Attention-deficit hyperactivity disorder, combined type: Secondary | ICD-10-CM | POA: Diagnosis not present

## 2016-12-24 DIAGNOSIS — F419 Anxiety disorder, unspecified: Secondary | ICD-10-CM | POA: Diagnosis not present

## 2016-12-24 DIAGNOSIS — Z79899 Other long term (current) drug therapy: Secondary | ICD-10-CM | POA: Diagnosis not present

## 2017-01-07 ENCOUNTER — Encounter: Payer: BLUE CROSS/BLUE SHIELD | Admitting: Family Medicine

## 2017-02-04 ENCOUNTER — Encounter: Payer: BLUE CROSS/BLUE SHIELD | Admitting: Family Medicine

## 2017-02-11 ENCOUNTER — Encounter: Payer: Self-pay | Admitting: Internal Medicine

## 2017-02-11 ENCOUNTER — Encounter: Payer: Self-pay | Admitting: Family Medicine

## 2017-02-11 ENCOUNTER — Ambulatory Visit (INDEPENDENT_AMBULATORY_CARE_PROVIDER_SITE_OTHER): Payer: BLUE CROSS/BLUE SHIELD | Admitting: Family Medicine

## 2017-02-11 VITALS — BP 129/81 | HR 77 | Temp 98.2°F | Resp 16 | Ht 72.0 in | Wt 257.5 lb

## 2017-02-11 DIAGNOSIS — G4733 Obstructive sleep apnea (adult) (pediatric): Secondary | ICD-10-CM

## 2017-02-11 DIAGNOSIS — R7301 Impaired fasting glucose: Secondary | ICD-10-CM | POA: Diagnosis not present

## 2017-02-11 DIAGNOSIS — Z1211 Encounter for screening for malignant neoplasm of colon: Secondary | ICD-10-CM

## 2017-02-11 DIAGNOSIS — Z125 Encounter for screening for malignant neoplasm of prostate: Secondary | ICD-10-CM | POA: Diagnosis not present

## 2017-02-11 DIAGNOSIS — Z23 Encounter for immunization: Secondary | ICD-10-CM

## 2017-02-11 DIAGNOSIS — Z0000 Encounter for general adult medical examination without abnormal findings: Secondary | ICD-10-CM | POA: Diagnosis not present

## 2017-02-11 HISTORY — DX: Obstructive sleep apnea (adult) (pediatric): G47.33

## 2017-02-11 LAB — CBC WITH DIFFERENTIAL/PLATELET
BASOS PCT: 0.8 % (ref 0.0–3.0)
Basophils Absolute: 0 10*3/uL (ref 0.0–0.1)
EOS ABS: 0.1 10*3/uL (ref 0.0–0.7)
EOS PCT: 2.1 % (ref 0.0–5.0)
HEMATOCRIT: 51.3 % (ref 39.0–52.0)
HEMOGLOBIN: 17.5 g/dL — AB (ref 13.0–17.0)
LYMPHS PCT: 34.3 % (ref 12.0–46.0)
Lymphs Abs: 1.9 10*3/uL (ref 0.7–4.0)
MCHC: 34 g/dL (ref 30.0–36.0)
MCV: 93.1 fl (ref 78.0–100.0)
MONOS PCT: 7.1 % (ref 3.0–12.0)
Monocytes Absolute: 0.4 10*3/uL (ref 0.1–1.0)
NEUTROS ABS: 3 10*3/uL (ref 1.4–7.7)
Neutrophils Relative %: 55.7 % (ref 43.0–77.0)
PLATELETS: 228 10*3/uL (ref 150.0–400.0)
RBC: 5.51 Mil/uL (ref 4.22–5.81)
RDW: 12.9 % (ref 11.5–15.5)
WBC: 5.4 10*3/uL (ref 4.0–10.5)

## 2017-02-11 LAB — LIPID PANEL
Cholesterol: 235 mg/dL — ABNORMAL HIGH (ref 0–200)
HDL: 54 mg/dL (ref >39.00)
LDL CALC: 144 mg/dL — AB (ref 0–99)
NONHDL: 181
Total CHOL/HDL Ratio: 4
Triglycerides: 184 mg/dL — ABNORMAL HIGH (ref 0.0–149.0)
VLDL: 36.8 mg/dL (ref 0.0–40.0)

## 2017-02-11 LAB — COMPREHENSIVE METABOLIC PANEL
ALBUMIN: 4.7 g/dL (ref 3.5–5.2)
ALK PHOS: 40 U/L (ref 39–117)
ALT: 32 U/L (ref 0–53)
AST: 14 U/L (ref 0–37)
BUN: 11 mg/dL (ref 6–23)
CHLORIDE: 102 meq/L (ref 96–112)
CO2: 28 mEq/L (ref 19–32)
Calcium: 9.9 mg/dL (ref 8.4–10.5)
Creatinine, Ser: 0.87 mg/dL (ref 0.40–1.50)
GFR: 98.2 mL/min (ref >60.00)
GLUCOSE: 106 mg/dL — AB (ref 70–99)
POTASSIUM: 3.9 meq/L (ref 3.5–5.1)
Sodium: 138 mEq/L (ref 135–145)
TOTAL PROTEIN: 6.9 g/dL (ref 6.0–8.3)
Total Bilirubin: 0.7 mg/dL (ref 0.2–1.2)

## 2017-02-11 LAB — HEMOGLOBIN A1C: HEMOGLOBIN A1C: 5.6 % (ref 4.6–6.5)

## 2017-02-11 LAB — PSA: PSA: 0.19 ng/mL (ref 0.10–4.00)

## 2017-02-11 LAB — TSH: TSH: 1.09 u[IU]/mL (ref 0.35–4.50)

## 2017-02-11 NOTE — Addendum Note (Signed)
Addended by: Smitty Knudsen on: 02/11/2017 10:30 AM   Modules accepted: Orders

## 2017-02-11 NOTE — Patient Instructions (Signed)

## 2017-02-11 NOTE — Progress Notes (Signed)
Office Note 02/11/2017  CC:  Chief Complaint  Patient presents with  . Annual Exam    Pt is not fasting.     HPI:  Guy Garner is a 51 y.o. White male who is here for annual health maintenance exam. Feeling fine, no acute complaints.  Eyes: about 1.5 yrs ago. Dental: UTD preventatives. Exercise: none right now. Diet: not working on anything.  Past Medical History:  Diagnosis Date  . Adult ADHD    Managed by Dr. Elisabeth Most  . Anxiety and depression    wellbutrin and citalopram in past  . Cellulitis and abscess of leg 03/2015   Right; I&D in ED 03/31/15 (MSSA)  . Facial lesion 07/2016   irritated seb keratosis (Dr. Lupton)--cryotherapy performed.  . Hyperlipemia, mixed 07/2015   Mild (10 yr framingham risk was 4.5%)-no meds indicated.  . Hypertension 03/2015  . IFG (impaired fasting glucose) 07/2015   Gluc 105.  A1c was 5.5% at that time.  . Insomnia    ambien in past    History reviewed. No pertinent surgical history.  Family History  Problem Relation Age of Onset  . Hypothyroidism Mother   . Dementia Mother   . Heart attack Father   . Diabetes Father   . Hyperlipidemia Father   . Heart disease Father   . Hypertension Father   . Diabetes Brother   . Stroke Brother   . Diabetes Maternal Grandmother   . Arthritis Paternal Grandmother   . Colon cancer Paternal Grandmother   . Stroke Paternal Grandfather     Social History   Social History  . Marital status: Single    Spouse name: N/A  . Number of children: N/A  . Years of education: N/A   Occupational History  . Not on file.   Social History Main Topics  . Smoking status: Never Smoker  . Smokeless tobacco: Never Used  . Alcohol use Yes  . Drug use: No  . Sexual activity: Not on file   Other Topics Concern  . Not on file   Social History Narrative   Divorced, 1 daughter.   Occupation: Hairdresser/stylist.   No T/A/Ds.    Outpatient Medications Prior to Visit  Medication Sig Dispense  Refill  . Amphet-Dextroamphet 3-Bead ER (MYDAYIS) 37.5 MG CP24 Take 1 capsule by mouth daily.    . enalapril (VASOTEC) 10 MG tablet 1.5 tabs po qd 45 tablet 7  . fluticasone (FLONASE) 50 MCG/ACT nasal spray Place into both nostrils daily.    Marland Kitchen PREVIDENT 5000 BOOSTER PLUS 1.1 % PSTE   3  . loratadine (CLARITIN) 10 MG tablet Take 10 mg by mouth daily.     No facility-administered medications prior to visit.     Allergies  Allergen Reactions  . Amlodipine Swelling    Lower legs swelling  . Doxycycline Nausea And Vomiting    ROS Review of Systems  Constitutional: Negative for appetite change, chills, fatigue and fever.  HENT: Negative for congestion, dental problem, ear pain and sore throat.   Eyes: Negative for discharge, redness and visual disturbance.  Respiratory: Negative for cough, chest tightness, shortness of breath and wheezing.   Cardiovascular: Negative for chest pain, palpitations and leg swelling.  Gastrointestinal: Negative for abdominal pain, blood in stool, diarrhea, nausea and vomiting.  Genitourinary: Negative for difficulty urinating, dysuria, flank pain, frequency, hematuria and urgency.  Musculoskeletal: Negative for arthralgias, back pain, joint swelling, myalgias and neck stiffness.  Skin: Negative for pallor and rash.  Neurological: Negative for  dizziness, speech difficulty, weakness and headaches.  Hematological: Negative for adenopathy. Does not bruise/bleed easily.  Psychiatric/Behavioral: Negative for confusion and sleep disturbance. The patient is not nervous/anxious.     PE; Blood pressure 129/81, pulse 77, temperature 98.2 F (36.8 C), temperature source Oral, resp. rate 16, height 6' (1.829 m), weight 257 lb 8 oz (116.8 kg), SpO2 97 %. Gen: Alert, well appearing.  Patient is oriented to person, place, time, and situation. AFFECT: pleasant, lucid thought and speech. ENT: Ears: EACs clear, normal epithelium.  TMs with good light reflex and landmarks  bilaterally.  Eyes: no injection, icteris, swelling, or exudate.  EOMI, PERRLA. Nose: no drainage or turbinate edema/swelling.  No injection or focal lesion.  Mouth: lips without lesion/swelling.  Oral mucosa pink and moist.  Dentition intact and without obvious caries or gingival swelling.  Oropharynx without erythema, exudate, or swelling.  Neck: supple/nontender.  No LAD, mass, or TM.  Carotid pulses 2+ bilaterally, without bruits. CV: RRR, no m/r/g.   LUNGS: CTA bilat, nonlabored resps, good aeration in all lung fields. ABD: soft, NT, ND, BS normal.  No hepatospenomegaly or mass.  No bruits. EXT: no clubbing, cyanosis, or edema.  Musculoskeletal: no joint swelling, erythema, warmth, or tenderness.  ROM of all joints intact. Skin - no sores or suspicious lesions or rashes or color changes Rectal exam: negative without mass, lesions or tenderness, PROSTATE EXAM: smooth and symmetric without nodules or tenderness.   Pertinent labs:  Lab Results  Component Value Date   TSH 0.66 08/01/2015   Lab Results  Component Value Date   WBC 5.7 08/01/2015   HGB 16.5 08/01/2015   HCT 48.2 08/01/2015   MCV 91.6 08/01/2015   PLT 263.0 08/01/2015   Lab Results  Component Value Date   CREATININE 0.87 08/01/2015   BUN 10 08/01/2015   NA 138 08/01/2015   K 4.1 08/01/2015   CL 105 08/01/2015   CO2 25 08/01/2015   Lab Results  Component Value Date   ALT 28 08/01/2015   AST 14 08/01/2015   ALKPHOS 40 08/01/2015   BILITOT 0.7 08/01/2015   Lab Results  Component Value Date   CHOL 228 (H) 08/01/2015   Lab Results  Component Value Date   HDL 53.80 08/01/2015   Lab Results  Component Value Date   LDLCALC 139 (H) 08/01/2015   Lab Results  Component Value Date   TRIG 179.0 (H) 08/01/2015   Lab Results  Component Value Date   CHOLHDL 4 08/01/2015    ASSESSMENT AND PLAN:   Health maintenance exam:  Reviewed age and gender appropriate health maintenance issues (prudent diet, regular  exercise, health risks of tobacco and excessive alcohol, use of seatbelts, fire alarms in home, use of sunscreen).  Also reviewed age and gender appropriate health screening as well as vaccine recommendations. Vaccines: UTD.  Flu vaccine today. Labs: fasting HP + A1c (hx of IFG). Prostate ca screening: DRE normal today , PSA today. Colon ca screening: referral today to GI for colonoscopy.    An After Visit Summary was printed and given to the patient.  FOLLOW UP:  Return in about 1 year (around 02/11/2018) for annual CPE (fasting).  Signed:  Santiago Bumpers, MD           02/11/2017

## 2017-02-12 ENCOUNTER — Encounter: Payer: Self-pay | Admitting: *Deleted

## 2017-02-27 ENCOUNTER — Encounter: Payer: Self-pay | Admitting: Family Medicine

## 2017-02-27 DIAGNOSIS — G4733 Obstructive sleep apnea (adult) (pediatric): Secondary | ICD-10-CM

## 2017-02-27 NOTE — Telephone Encounter (Signed)
[facility] ordered.-thx

## 2017-02-27 NOTE — Telephone Encounter (Signed)
Please advise. Thanks.  

## 2017-03-11 NOTE — Telephone Encounter (Signed)
Patient has not met deductible so he cannot afford the sleep study. He didn't realize it was going to be $3000. He will be getting different insurance next year so he may be able to afford it next year.

## 2017-04-08 ENCOUNTER — Telehealth: Payer: Self-pay | Admitting: Internal Medicine

## 2017-04-08 ENCOUNTER — Ambulatory Visit (AMBULATORY_SURGERY_CENTER): Payer: Self-pay | Admitting: *Deleted

## 2017-04-08 ENCOUNTER — Other Ambulatory Visit: Payer: Self-pay

## 2017-04-08 VITALS — Ht 73.0 in | Wt 254.0 lb

## 2017-04-08 DIAGNOSIS — Z1211 Encounter for screening for malignant neoplasm of colon: Secondary | ICD-10-CM

## 2017-04-08 DIAGNOSIS — F902 Attention-deficit hyperactivity disorder, combined type: Secondary | ICD-10-CM | POA: Diagnosis not present

## 2017-04-08 DIAGNOSIS — Z79899 Other long term (current) drug therapy: Secondary | ICD-10-CM | POA: Diagnosis not present

## 2017-04-08 MED ORDER — NA SULFATE-K SULFATE-MG SULF 17.5-3.13-1.6 GM/177ML PO SOLN: ORAL | 0 refills | Status: DC

## 2017-04-08 NOTE — Progress Notes (Signed)
Patient denies any allergies to eggs or soy. Patient denies any problems with anesthesia/sedation. Patient denies any oxygen use at home. Patient denies taking any diet/weight loss medications or blood thinners. EMMI education assisgned to patient on colonoscopy, this was explained and instructions given to patient. 

## 2017-04-08 NOTE — Telephone Encounter (Signed)
Coupon called into pharmacy for patient. Called patient and notified him.

## 2017-04-22 ENCOUNTER — Encounter: Payer: BLUE CROSS/BLUE SHIELD | Admitting: Internal Medicine

## 2017-05-23 ENCOUNTER — Other Ambulatory Visit: Payer: Self-pay | Admitting: Family Medicine

## 2017-05-28 ENCOUNTER — Encounter: Payer: Self-pay | Admitting: Family Medicine

## 2017-05-28 NOTE — Telephone Encounter (Signed)
I have printed out the information from the link pt mentions in his mychart message. I have put this on your desk. From what I understood, pt would like to have a home sleep test done and insurance needs a PA done for this. Please advise. Thanks.

## 2017-05-29 ENCOUNTER — Encounter: Payer: Self-pay | Admitting: Family Medicine

## 2017-05-30 NOTE — Telephone Encounter (Signed)
I don't do anything to do with sleep studies or sleep apnea---I refer all of it out to specialists. If he needs a referral to another sleep specialist I can do that, but I can't submit or authorize any orders for sleep studies, CPAP machines, or CPAP equipment etc for him.  Tell him sorry.-thx

## 2017-07-08 DIAGNOSIS — Z79899 Other long term (current) drug therapy: Secondary | ICD-10-CM | POA: Diagnosis not present

## 2017-07-08 DIAGNOSIS — F419 Anxiety disorder, unspecified: Secondary | ICD-10-CM | POA: Diagnosis not present

## 2017-07-08 DIAGNOSIS — I10 Essential (primary) hypertension: Secondary | ICD-10-CM | POA: Diagnosis not present

## 2017-07-08 DIAGNOSIS — F902 Attention-deficit hyperactivity disorder, combined type: Secondary | ICD-10-CM | POA: Diagnosis not present

## 2017-08-13 DIAGNOSIS — F4323 Adjustment disorder with mixed anxiety and depressed mood: Secondary | ICD-10-CM | POA: Diagnosis not present

## 2017-08-17 DIAGNOSIS — F4323 Adjustment disorder with mixed anxiety and depressed mood: Secondary | ICD-10-CM | POA: Diagnosis not present

## 2017-08-24 DIAGNOSIS — F4323 Adjustment disorder with mixed anxiety and depressed mood: Secondary | ICD-10-CM | POA: Diagnosis not present

## 2017-09-23 DIAGNOSIS — F4323 Adjustment disorder with mixed anxiety and depressed mood: Secondary | ICD-10-CM | POA: Diagnosis not present

## 2017-09-30 DIAGNOSIS — I10 Essential (primary) hypertension: Secondary | ICD-10-CM | POA: Diagnosis not present

## 2017-09-30 DIAGNOSIS — Z79899 Other long term (current) drug therapy: Secondary | ICD-10-CM | POA: Diagnosis not present

## 2017-09-30 DIAGNOSIS — F4323 Adjustment disorder with mixed anxiety and depressed mood: Secondary | ICD-10-CM | POA: Diagnosis not present

## 2017-09-30 DIAGNOSIS — F419 Anxiety disorder, unspecified: Secondary | ICD-10-CM | POA: Diagnosis not present

## 2017-09-30 DIAGNOSIS — F902 Attention-deficit hyperactivity disorder, combined type: Secondary | ICD-10-CM | POA: Diagnosis not present

## 2017-10-21 DIAGNOSIS — F4323 Adjustment disorder with mixed anxiety and depressed mood: Secondary | ICD-10-CM | POA: Diagnosis not present

## 2017-12-30 ENCOUNTER — Other Ambulatory Visit: Payer: Self-pay | Admitting: Family Medicine

## 2017-12-30 DIAGNOSIS — F419 Anxiety disorder, unspecified: Secondary | ICD-10-CM | POA: Diagnosis not present

## 2017-12-30 DIAGNOSIS — Z79899 Other long term (current) drug therapy: Secondary | ICD-10-CM | POA: Diagnosis not present

## 2017-12-30 DIAGNOSIS — F902 Attention-deficit hyperactivity disorder, combined type: Secondary | ICD-10-CM | POA: Diagnosis not present

## 2018-02-17 ENCOUNTER — Encounter: Payer: BLUE CROSS/BLUE SHIELD | Admitting: Family Medicine

## 2018-03-17 ENCOUNTER — Encounter: Payer: BLUE CROSS/BLUE SHIELD | Admitting: Family Medicine

## 2018-04-07 ENCOUNTER — Encounter: Payer: BLUE CROSS/BLUE SHIELD | Admitting: Family Medicine

## 2018-04-08 ENCOUNTER — Encounter: Payer: Self-pay | Admitting: *Deleted

## 2018-04-08 DIAGNOSIS — F419 Anxiety disorder, unspecified: Secondary | ICD-10-CM

## 2018-04-08 DIAGNOSIS — F329 Major depressive disorder, single episode, unspecified: Secondary | ICD-10-CM | POA: Insufficient documentation

## 2018-04-08 DIAGNOSIS — F32A Depression, unspecified: Secondary | ICD-10-CM | POA: Insufficient documentation

## 2018-04-14 ENCOUNTER — Encounter: Payer: BLUE CROSS/BLUE SHIELD | Admitting: Family Medicine

## 2018-04-21 ENCOUNTER — Ambulatory Visit (INDEPENDENT_AMBULATORY_CARE_PROVIDER_SITE_OTHER): Payer: BLUE CROSS/BLUE SHIELD | Admitting: Family Medicine

## 2018-04-21 ENCOUNTER — Encounter: Payer: Self-pay | Admitting: Family Medicine

## 2018-04-21 VITALS — BP 117/79 | HR 98 | Temp 97.9°F | Resp 16 | Ht 73.2 in | Wt 258.0 lb

## 2018-04-21 DIAGNOSIS — R7301 Impaired fasting glucose: Secondary | ICD-10-CM

## 2018-04-21 DIAGNOSIS — Z125 Encounter for screening for malignant neoplasm of prostate: Secondary | ICD-10-CM

## 2018-04-21 DIAGNOSIS — Z0000 Encounter for general adult medical examination without abnormal findings: Secondary | ICD-10-CM | POA: Diagnosis not present

## 2018-04-21 DIAGNOSIS — I10 Essential (primary) hypertension: Secondary | ICD-10-CM

## 2018-04-21 DIAGNOSIS — F902 Attention-deficit hyperactivity disorder, combined type: Secondary | ICD-10-CM | POA: Diagnosis not present

## 2018-04-21 DIAGNOSIS — Z23 Encounter for immunization: Secondary | ICD-10-CM

## 2018-04-21 DIAGNOSIS — Z79899 Other long term (current) drug therapy: Secondary | ICD-10-CM | POA: Diagnosis not present

## 2018-04-21 DIAGNOSIS — E669 Obesity, unspecified: Secondary | ICD-10-CM | POA: Insufficient documentation

## 2018-04-21 DIAGNOSIS — Z1211 Encounter for screening for malignant neoplasm of colon: Secondary | ICD-10-CM

## 2018-04-21 DIAGNOSIS — F419 Anxiety disorder, unspecified: Secondary | ICD-10-CM | POA: Diagnosis not present

## 2018-04-21 DIAGNOSIS — E66811 Obesity, class 1: Secondary | ICD-10-CM | POA: Insufficient documentation

## 2018-04-21 LAB — CBC WITH DIFFERENTIAL/PLATELET
BASOS ABS: 0 10*3/uL (ref 0.0–0.1)
Basophils Relative: 0.7 % (ref 0.0–3.0)
EOS ABS: 0.1 10*3/uL (ref 0.0–0.7)
Eosinophils Relative: 1.2 % (ref 0.0–5.0)
HCT: 50.1 % (ref 39.0–52.0)
HEMOGLOBIN: 17.1 g/dL — AB (ref 13.0–17.0)
Lymphocytes Relative: 26.3 % (ref 12.0–46.0)
Lymphs Abs: 1.8 10*3/uL (ref 0.7–4.0)
MCHC: 34 g/dL (ref 30.0–36.0)
MCV: 92.1 fl (ref 78.0–100.0)
MONO ABS: 0.5 10*3/uL (ref 0.1–1.0)
Monocytes Relative: 6.7 % (ref 3.0–12.0)
Neutro Abs: 4.5 10*3/uL (ref 1.4–7.7)
Neutrophils Relative %: 65.1 % (ref 43.0–77.0)
Platelets: 254 10*3/uL (ref 150.0–400.0)
RBC: 5.44 Mil/uL (ref 4.22–5.81)
RDW: 13.1 % (ref 11.5–15.5)
WBC: 6.9 10*3/uL (ref 4.0–10.5)

## 2018-04-21 LAB — COMPREHENSIVE METABOLIC PANEL
ALT: 29 U/L (ref 0–53)
AST: 16 U/L (ref 0–37)
Albumin: 4.8 g/dL (ref 3.5–5.2)
Alkaline Phosphatase: 42 U/L (ref 39–117)
BUN: 14 mg/dL (ref 6–23)
CO2: 28 mEq/L (ref 19–32)
Calcium: 9.6 mg/dL (ref 8.4–10.5)
Chloride: 102 mEq/L (ref 96–112)
Creatinine, Ser: 0.85 mg/dL (ref 0.40–1.50)
GFR: 100.4 mL/min (ref >60.00)
Glucose, Bld: 107 mg/dL — ABNORMAL HIGH (ref 70–99)
Potassium: 4.2 mEq/L (ref 3.5–5.1)
Sodium: 137 mEq/L (ref 135–145)
Total Bilirubin: 0.6 mg/dL (ref 0.2–1.2)
Total Protein: 7.1 g/dL (ref 6.0–8.3)

## 2018-04-21 LAB — HEMOGLOBIN A1C: HEMOGLOBIN A1C: 5.7 % (ref 4.6–6.5)

## 2018-04-21 LAB — TSH: TSH: 1.18 u[IU]/mL (ref 0.35–4.50)

## 2018-04-21 LAB — LIPID PANEL
Cholesterol: 240 mg/dL — ABNORMAL HIGH (ref 0–200)
HDL: 56.4 mg/dL (ref >39.00)
LDL Cholesterol: 145 mg/dL — ABNORMAL HIGH (ref 0–99)
NonHDL: 183.46
Total CHOL/HDL Ratio: 4
Triglycerides: 190 mg/dL — ABNORMAL HIGH (ref 0.0–149.0)
VLDL: 38 mg/dL (ref 0.0–40.0)

## 2018-04-21 LAB — PSA: PSA: 0.19 ng/mL (ref 0.10–4.00)

## 2018-04-21 MED ORDER — ENALAPRIL MALEATE 10 MG PO TABS: 15.0000 mg | ORAL_TABLET | Freq: Every day | ORAL | 3 refills | Status: DC
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
## 2018-04-21 NOTE — Patient Instructions (Signed)

## 2018-06-18 ENCOUNTER — Encounter: Payer: Self-pay | Admitting: Family Medicine

## 2018-07-04 ENCOUNTER — Encounter: Payer: Self-pay | Admitting: Family Medicine

## 2018-07-29 ENCOUNTER — Encounter: Payer: Self-pay | Admitting: Family Medicine

## 2018-08-12 DIAGNOSIS — F419 Anxiety disorder, unspecified: Secondary | ICD-10-CM | POA: Diagnosis not present

## 2018-08-12 DIAGNOSIS — F902 Attention-deficit hyperactivity disorder, combined type: Secondary | ICD-10-CM | POA: Diagnosis not present

## 2018-08-13 ENCOUNTER — Encounter: Payer: Self-pay | Admitting: Family Medicine

## 2018-10-20 ENCOUNTER — Ambulatory Visit: Payer: BLUE CROSS/BLUE SHIELD | Admitting: Family Medicine

## 2018-11-10 DIAGNOSIS — F902 Attention-deficit hyperactivity disorder, combined type: Secondary | ICD-10-CM | POA: Diagnosis not present

## 2018-11-10 DIAGNOSIS — I10 Essential (primary) hypertension: Secondary | ICD-10-CM | POA: Diagnosis not present

## 2018-11-10 DIAGNOSIS — Z79899 Other long term (current) drug therapy: Secondary | ICD-10-CM | POA: Diagnosis not present

## 2018-11-10 DIAGNOSIS — F419 Anxiety disorder, unspecified: Secondary | ICD-10-CM | POA: Diagnosis not present

## 2018-11-20 ENCOUNTER — Encounter: Payer: Self-pay | Admitting: Family Medicine

## 2018-11-20 NOTE — Telephone Encounter (Signed)
He can be put on my schedule 11/21/18. If my schedule is full then he can be worked in.

## 2018-11-21 ENCOUNTER — Telehealth: Payer: Self-pay

## 2018-11-21 ENCOUNTER — Ambulatory Visit: Payer: BC Managed Care – PPO

## 2018-11-21 ENCOUNTER — Ambulatory Visit (INDEPENDENT_AMBULATORY_CARE_PROVIDER_SITE_OTHER): Payer: BC Managed Care – PPO | Admitting: Family Medicine

## 2018-11-21 ENCOUNTER — Other Ambulatory Visit: Payer: Self-pay

## 2018-11-21 ENCOUNTER — Encounter: Payer: Self-pay | Admitting: Family Medicine

## 2018-11-21 VITALS — BP 136/88 | HR 74 | Temp 98.7°F | Resp 16 | Ht 73.2 in | Wt 259.2 lb

## 2018-11-21 DIAGNOSIS — I8001 Phlebitis and thrombophlebitis of superficial vessels of right lower extremity: Secondary | ICD-10-CM

## 2018-11-21 DIAGNOSIS — M79661 Pain in right lower leg: Secondary | ICD-10-CM

## 2018-11-21 DIAGNOSIS — L539 Erythematous condition, unspecified: Secondary | ICD-10-CM | POA: Diagnosis not present

## 2018-11-21 HISTORY — PX: OTHER SURGICAL HISTORY: SHX169

## 2018-11-21 MED ORDER — CEPHALEXIN 500 MG PO CAPS: 500.0000 mg | ORAL_CAPSULE | Freq: Three times a day (TID) | ORAL | 0 refills | Status: DC

## 2018-11-21 NOTE — Progress Notes (Signed)
OFFICE VISIT  11/21/2018   CC:  Chief Complaint  Patient presents with  . calf pain    right    HPI:    Patient is a 53 y.o. Caucasian male who presents for right calf pain. Onset of pain in R leg in medial area just inferior to knee joint.  Onset 2 nights ago and has spread down medial aspect of calf.  Feels a warmth, pulling, throbbing in the area.  Put ice on it, elevated it, took tylenol and this helped temporarily.  No recent surgical procedure, no prolonged immobilization, no prior hx of DVT or FH of DVT. No recent strain/injury. No fever, chills, malaise, cough, or SOB.  Past Medical History:  Diagnosis Date  . Adult ADHD    Managed by Dr. Johnnye Sima  . Anxiety and depression    wellbutrin and citalopram in past  . Cellulitis and abscess of leg 03/2015   Right; I&D in ED 03/31/15 ([facility])  . Facial lesion 07/2016   irritated seb keratosis (Dr. Lupton)--cryotherapy performed.  . Hyperlipemia, mixed 07/2015; 01/2017   Mild (10 yr framingham risk @ 5%)-no meds indicated.  . Hypertension 03/2015  . IFG (impaired fasting glucose) 07/2015; 01/2017   Low 100-110.  A1c 5.5-5.6%.  . Insomnia    ambien in past  . OSA (obstructive sleep apnea) 02/2017   Question of: pt sent my chart message describing classic sx's---referred to [facility] in [place] at that time (02/27/17).    Past Surgical History:  Procedure Laterality Date  . WISDOM TOOTH EXTRACTION      Outpatient Medications Prior to Visit  Medication Sig Dispense Refill  . Amphet-Dextroamphet 3-Bead ER (MYDAYIS) 37.5 MG CP24 Take 1 capsule by mouth daily.    Marland Kitchen aspirin EC 81 MG tablet Take 81 mg by mouth daily.    . citalopram (CELEXA) 20 MG tablet Take 1 tab by mouth daily.    . enalapril (VASOTEC) 10 MG tablet Take 1.5 tablets (15 mg total) by mouth daily. 135 tablet 3  . fluticasone (FLONASE) 50 MCG/ACT nasal spray Place into both nostrils daily.    Marland Kitchen ALPRAZolam (XANAX) 0.5 MG tablet Take 1 tablet  by mouth as needed.  0  . PREVIDENT 5000 BOOSTER PLUS 1.1 % PSTE   3   No facility-administered medications prior to visit.     Allergies  Allergen Reactions  . Amlodipine Swelling    Lower legs swelling  . Doxycycline Nausea And Vomiting    ROS As per HPI  PE: Blood pressure 136/88, pulse 74, temperature 98.7 F (37.1 C), temperature source Temporal, resp. rate 16, height 6' 1.2" (1.859 m), weight 259 lb 3.2 oz (117.6 kg), SpO2 97 %. Gen: Alert, well appearing.  Patient is oriented to person, place, time, and situation. AFFECT: pleasant, lucid thought and speech. R proximal/medial calf with focal area of induration but no nodule: redness begins at this area and extends in fusiform shape down to about mid calf medially.  Some TTP and warmth in the region is noted. 1-2+ pitting edema R LL. 1+ pitting edema L LL.   LABS:    Chemistry      Component Value Date/Time   NA 137 04/21/2018 1621   K 4.2 04/21/2018 1621   CL 102 04/21/2018 1621   CO2 28 04/21/2018 1621   BUN 14 04/21/2018 1621   CREATININE 0.85 04/21/2018 1621      Component Value Date/Time   CALCIUM 9.6 04/21/2018 1621   ALKPHOS  42 04/21/2018 1621   AST 16 04/21/2018 1621   ALT 29 04/21/2018 1621   BILITOT 0.6 04/21/2018 1621     Lab Results  Component Value Date   WBC 6.9 04/21/2018   HGB 17.1 (H) 04/21/2018   HCT 50.1 04/21/2018   MCV 92.1 04/21/2018   PLT 254.0 04/21/2018    IMPRESSION AND PLAN:  R calf pain with erythema. Suspect superficial phlebitis. However, he has had cellulitis in the area before and his area of erythema does extend out a bit more than what I would expect for simple superficial phlebitis. DVT less likely but will check LE venous doppler today.  Keflex 500mg  tid x 7d. Ice, elevation, NSAIDs discussed.  An After Visit Summary was printed and given to the patient.  FOLLOW UP: Return for to be determined based on result of eval.  Signed:  Santiago BumpersPhil McGowen, MD            11/21/2018

## 2018-11-21 NOTE — Telephone Encounter (Signed)
Voicemail was left with call report for pt's DVT study, it was negative for DVT in R leg.

## 2018-11-21 NOTE — Telephone Encounter (Signed)
Patient advised, expressed understanding.  

## 2018-11-21 NOTE — Telephone Encounter (Signed)
Great. Pls call patient and reassure him that he DOES NOT have a blood clot in leg. Continue with plan as discussed at his office visit today.-thx

## 2018-11-21 NOTE — Addendum Note (Signed)
Addended by: Tammi Sou on: 11/21/2018 10:16 AM   Modules accepted: Orders

## 2018-11-21 NOTE — Telephone Encounter (Signed)
Pt added on schedule for today.

## 2018-11-24 ENCOUNTER — Encounter: Payer: Self-pay | Admitting: Family Medicine

## 2018-11-28 ENCOUNTER — Encounter: Payer: Self-pay | Admitting: Family Medicine

## 2019-03-03 DIAGNOSIS — Z79899 Other long term (current) drug therapy: Secondary | ICD-10-CM | POA: Diagnosis not present

## 2019-03-03 DIAGNOSIS — F902 Attention-deficit hyperactivity disorder, combined type: Secondary | ICD-10-CM | POA: Diagnosis not present

## 2019-03-03 DIAGNOSIS — F419 Anxiety disorder, unspecified: Secondary | ICD-10-CM | POA: Diagnosis not present

## 2019-06-08 DIAGNOSIS — F419 Anxiety disorder, unspecified: Secondary | ICD-10-CM | POA: Diagnosis not present

## 2019-06-08 DIAGNOSIS — F902 Attention-deficit hyperactivity disorder, combined type: Secondary | ICD-10-CM | POA: Diagnosis not present

## 2019-06-08 DIAGNOSIS — Z79899 Other long term (current) drug therapy: Secondary | ICD-10-CM | POA: Diagnosis not present

## 2019-07-13 ENCOUNTER — Encounter: Payer: Self-pay | Admitting: Family Medicine

## 2019-07-13 ENCOUNTER — Other Ambulatory Visit: Payer: Self-pay

## 2019-07-13 DIAGNOSIS — I10 Essential (primary) hypertension: Secondary | ICD-10-CM

## 2019-07-13 DIAGNOSIS — Z125 Encounter for screening for malignant neoplasm of prostate: Secondary | ICD-10-CM

## 2019-07-13 NOTE — Telephone Encounter (Signed)
Pt has [employer] for insurance plan, I believe this can be virtual. Just seeking your approval for this. Please advise, thanks.

## 2019-07-13 NOTE — Telephone Encounter (Signed)
Pt would like to know if he can have labs done at another facility or does he have to come here for lab visit? Please advise, thanks.

## 2019-07-13 NOTE — Telephone Encounter (Signed)
Virtual ok for CPE. He will need to come in for lab visit, though. If he wants to do the labs prior and have them available for review at the time of the virtual visit then pls set this up and order CBC w/diff, CMET, FLP, TSH, and PSA (dx essential HTN, prostate cancer screening)-thx

## 2019-07-14 ENCOUNTER — Other Ambulatory Visit: Payer: Self-pay

## 2019-07-14 DIAGNOSIS — E782 Mixed hyperlipidemia: Secondary | ICD-10-CM

## 2019-07-14 DIAGNOSIS — I10 Essential (primary) hypertension: Secondary | ICD-10-CM

## 2019-07-14 DIAGNOSIS — Z125 Encounter for screening for malignant neoplasm of prostate: Secondary | ICD-10-CM

## 2019-07-14 DIAGNOSIS — R7301 Impaired fasting glucose: Secondary | ICD-10-CM

## 2019-07-14 IMAGING — US RIGHT LOWER EXTREMITY VENOUS ULTRASOUND
1 series · 13 of 24 positions shown · non-contrast
Comparison: None.

CLINICAL DATA: 53-year-old male with right calf pain for the past 3
days



[Series 1: right lower extremity venous ultrasound · 0.09mm/px · 13 of 46 slices shown]
[im 1/46]
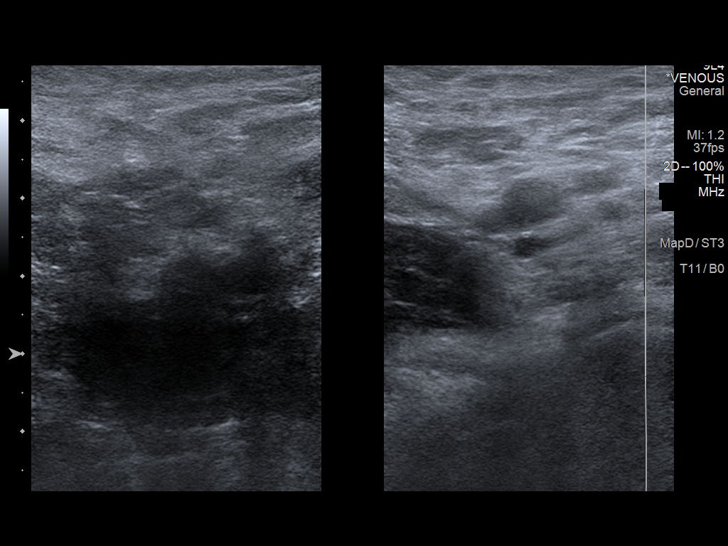
[im 4/46]
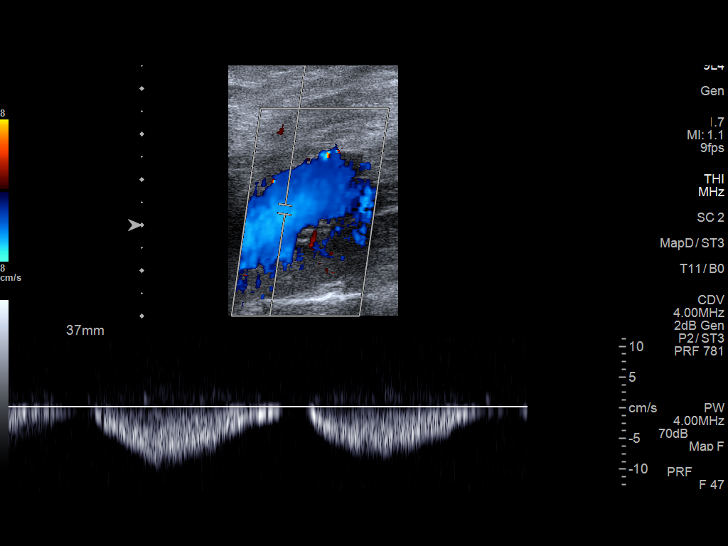
[im 8/46]
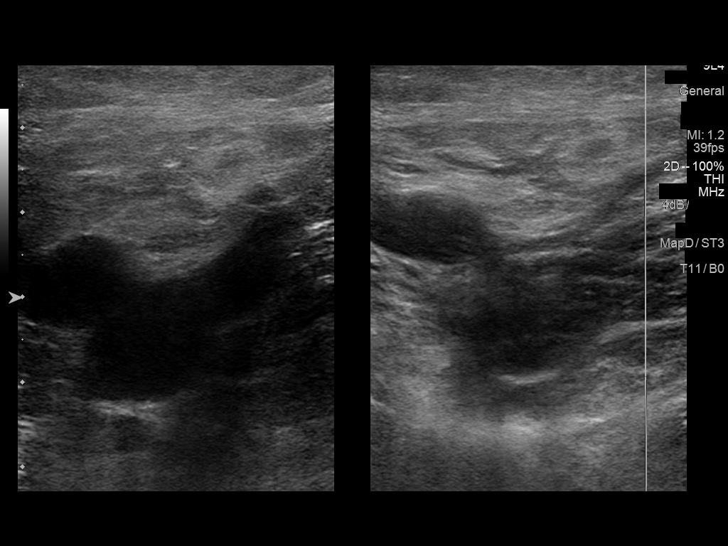
[im 12/46]
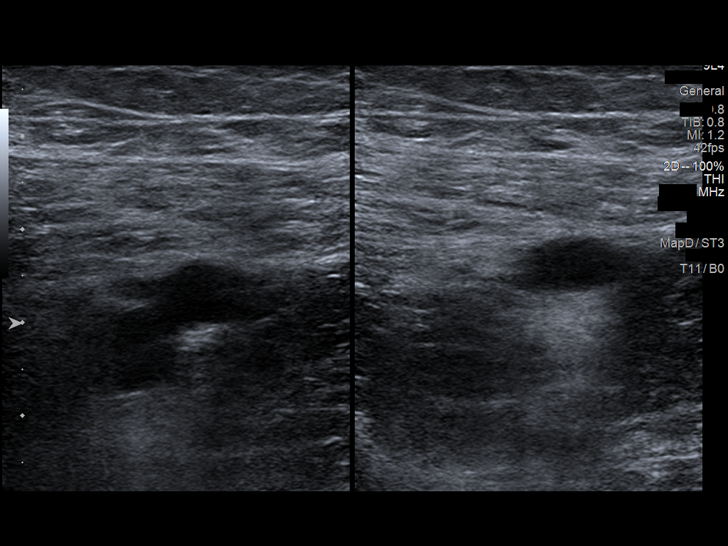
[im 16/46]
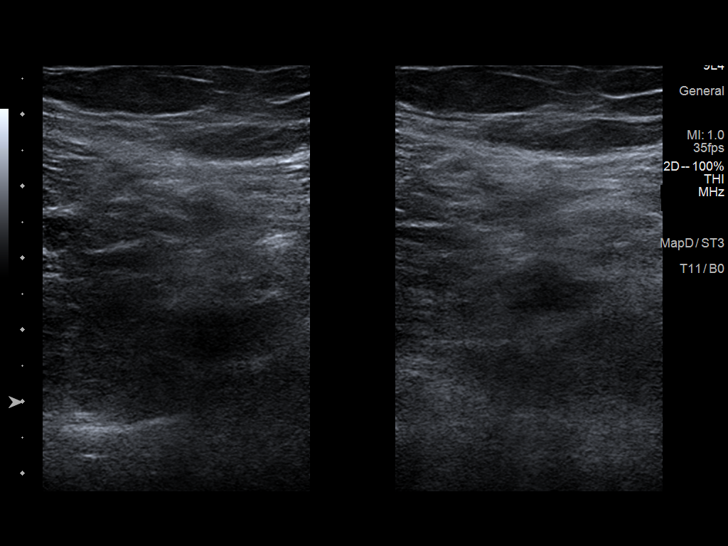
[im 20/46]
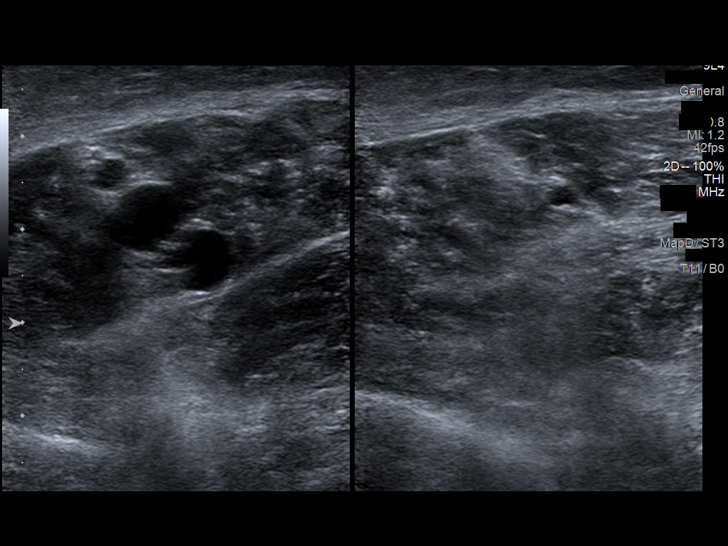
[im 24/46]
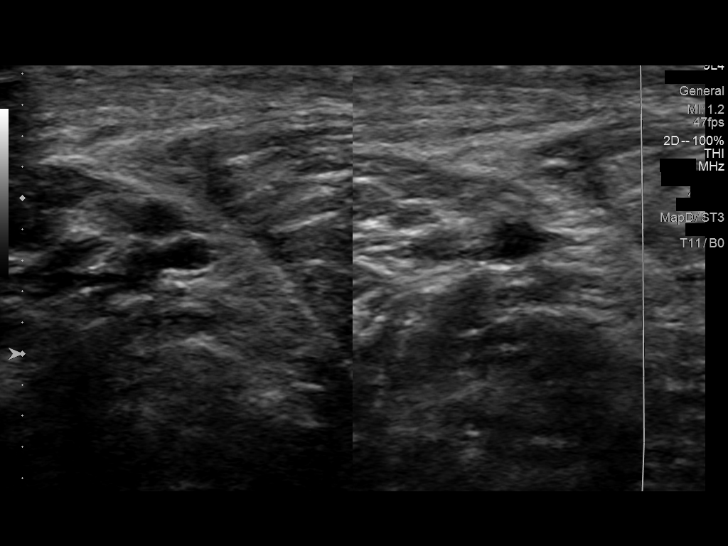
[im 26/46]
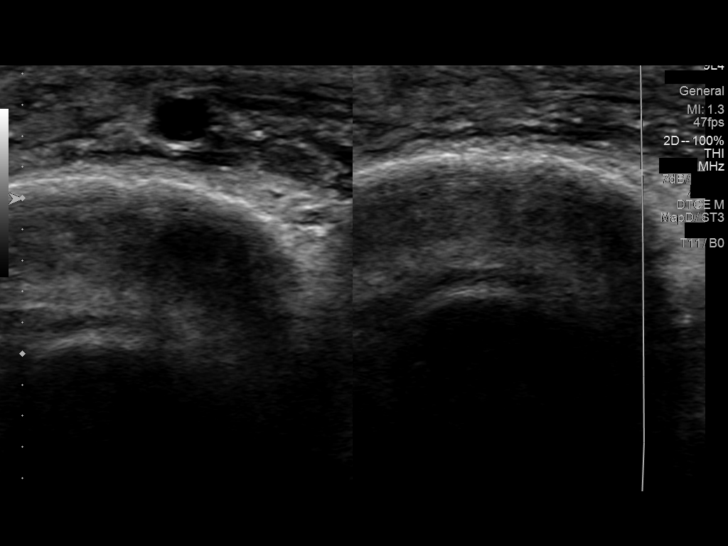
[im 30/46]
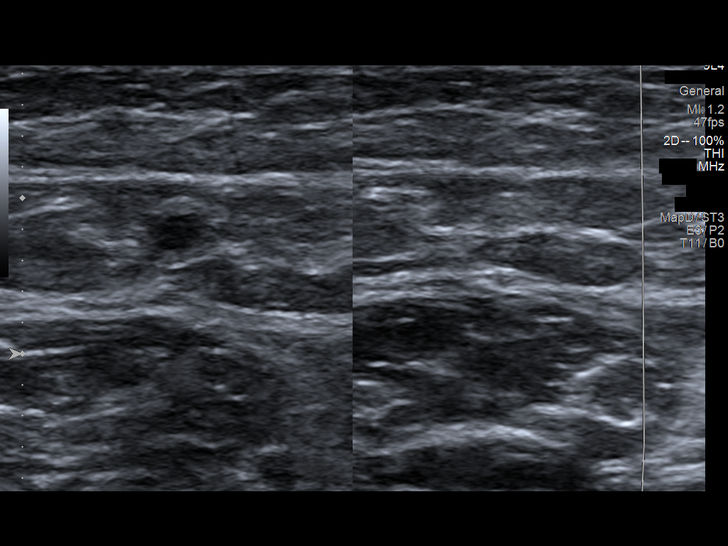
[im 34/46]
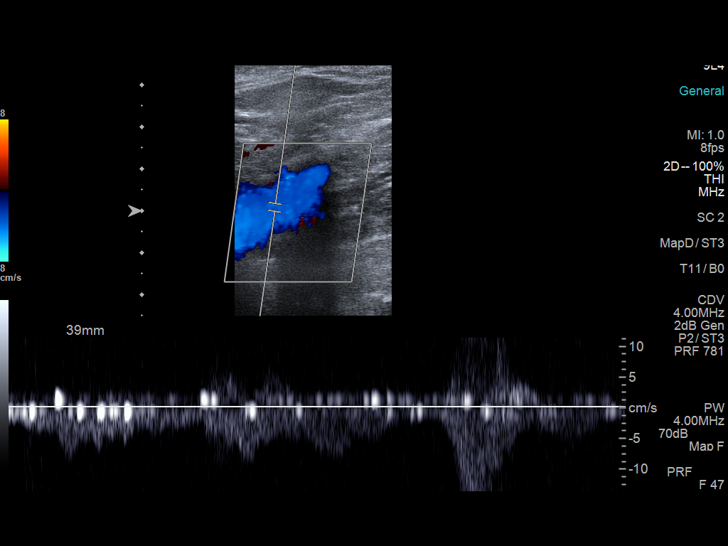
[im 38/46]
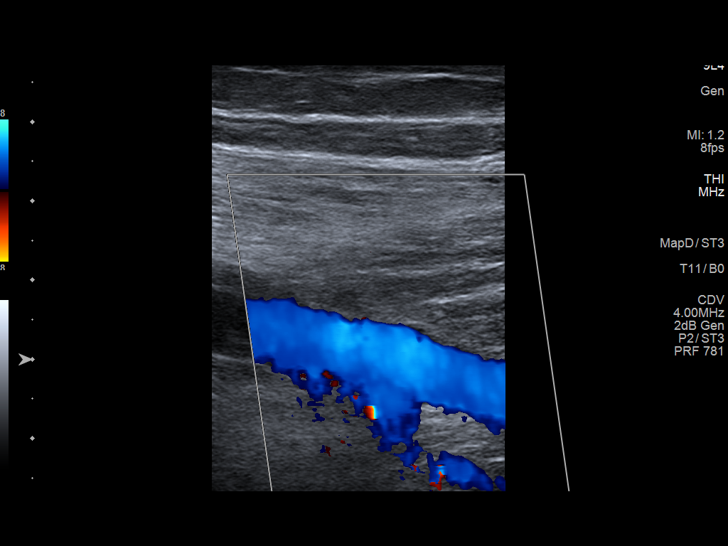
[im 42/46]
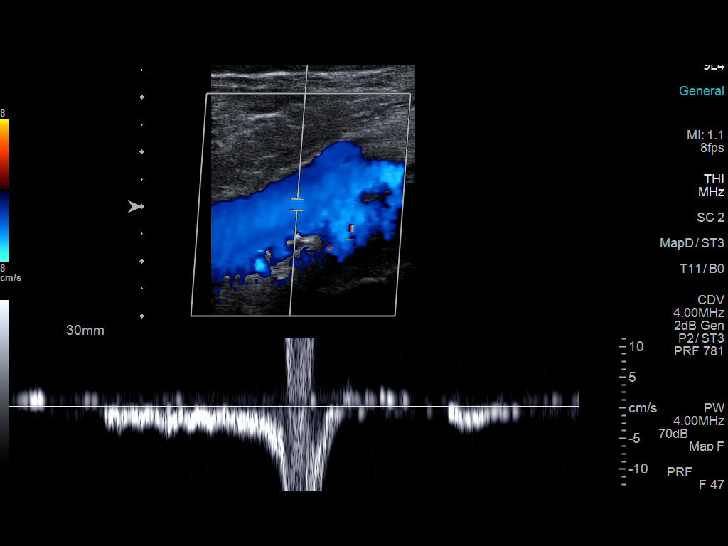
[im 46/46]
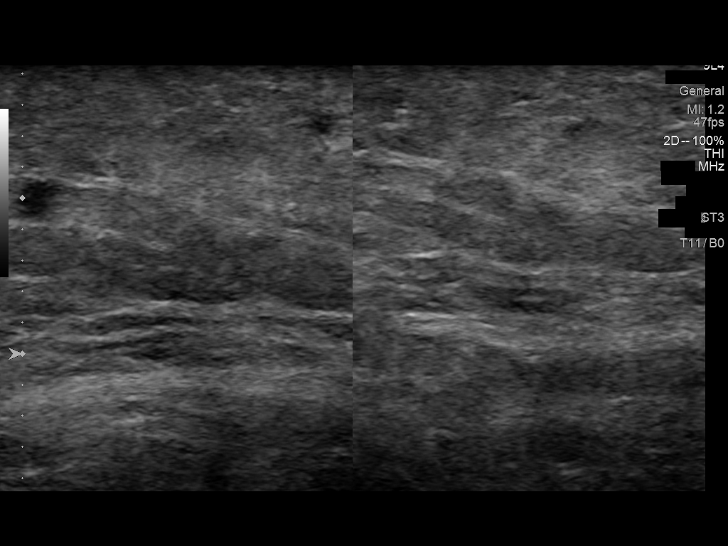

[13 of 24 positions shown; findings below may reference images not displayed]

FINDINGS: Contralateral Common Femoral Vein: Respiratory phasicity is normal
and symmetric with the symptomatic side. No evidence of thrombus.
Normal compressibility.

Common Femoral Vein: No evidence of thrombus. Normal
compressibility, respiratory phasicity and response to augmentation.

Saphenofemoral Junction: No evidence of thrombus. Normal
compressibility and flow on color Doppler imaging.

Profunda Femoral Vein: No evidence of thrombus. Normal
compressibility and flow on color Doppler imaging.

Femoral Vein: No evidence of thrombus. Normal compressibility,
respiratory phasicity and response to augmentation.

Popliteal Vein: No evidence of thrombus. Normal compressibility,
respiratory phasicity and response to augmentation.

Calf Veins: No evidence of thrombus. Normal compressibility and flow
on color Doppler imaging.

Superficial Great Saphenous Vein: No evidence of thrombus. Normal
compressibility.

Venous Reflux:  None.

Other Findings:  None.
IMPRESSION: No evidence of deep venous thrombosis.

## 2019-07-14 NOTE — Telephone Encounter (Signed)
The closest I could arrange them is [facility]. (Is the [facility] moved to [facility]?) If he wants to do this then pls order CBC w/diff, CMET, FLP, TSH, PSA, and HbA1c for [facility]. Dx mixed hyperlipidemia, essential hypertension, impaired fasting glucose, and prostate cancer screening.-thx

## 2019-08-20 ENCOUNTER — Other Ambulatory Visit: Payer: Self-pay

## 2019-08-20 ENCOUNTER — Encounter: Payer: Self-pay | Admitting: Family Medicine

## 2019-08-20 MED ORDER — ENALAPRIL MALEATE 10 MG PO TABS: 15.0000 mg | ORAL_TABLET | Freq: Every day | ORAL | 0 refills | Status: DC

## 2019-08-23 ENCOUNTER — Encounter: Payer: Self-pay | Admitting: Family Medicine

## 2019-08-24 ENCOUNTER — Encounter: Payer: Self-pay | Admitting: Family Medicine

## 2019-08-24 ENCOUNTER — Telehealth (INDEPENDENT_AMBULATORY_CARE_PROVIDER_SITE_OTHER): Payer: BC Managed Care – PPO | Admitting: Family Medicine

## 2019-08-24 VITALS — BP 139/88 | HR 67

## 2019-08-24 DIAGNOSIS — I10 Essential (primary) hypertension: Secondary | ICD-10-CM

## 2019-08-24 MED ORDER — ENALAPRIL MALEATE 20 MG PO TABS: 20.0000 mg | ORAL_TABLET | Freq: Every day | ORAL | 1 refills | Status: DC
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
## 2019-08-31 DIAGNOSIS — F902 Attention-deficit hyperactivity disorder, combined type: Secondary | ICD-10-CM | POA: Diagnosis not present

## 2019-08-31 DIAGNOSIS — F419 Anxiety disorder, unspecified: Secondary | ICD-10-CM | POA: Diagnosis not present

## 2019-08-31 DIAGNOSIS — Z79899 Other long term (current) drug therapy: Secondary | ICD-10-CM | POA: Diagnosis not present

## 2019-09-08 ENCOUNTER — Encounter: Payer: Self-pay | Admitting: Family Medicine

## 2019-09-08 NOTE — Telephone Encounter (Signed)
Please advise, thanks.

## 2019-09-08 NOTE — Telephone Encounter (Signed)
Are you able to give specific names or locations for the referral? Please see message below.

## 2019-09-09 ENCOUNTER — Encounter: Payer: Self-pay | Admitting: Family Medicine

## 2019-09-09 DIAGNOSIS — G4730 Sleep apnea, unspecified: Secondary | ICD-10-CM

## 2019-09-09 NOTE — Telephone Encounter (Signed)
Patient would like referral done to [facility].

## 2019-09-09 NOTE — Telephone Encounter (Signed)
OK referral ordered. 

## 2019-09-14 ENCOUNTER — Encounter: Payer: Self-pay | Admitting: Family Medicine

## 2019-09-14 ENCOUNTER — Other Ambulatory Visit: Payer: Self-pay

## 2019-09-14 ENCOUNTER — Other Ambulatory Visit (INDEPENDENT_AMBULATORY_CARE_PROVIDER_SITE_OTHER): Payer: BC Managed Care – PPO

## 2019-09-14 DIAGNOSIS — I10 Essential (primary) hypertension: Secondary | ICD-10-CM

## 2019-09-14 DIAGNOSIS — Z125 Encounter for screening for malignant neoplasm of prostate: Secondary | ICD-10-CM | POA: Diagnosis not present

## 2019-09-14 DIAGNOSIS — E782 Mixed hyperlipidemia: Secondary | ICD-10-CM | POA: Diagnosis not present

## 2019-09-14 DIAGNOSIS — R7301 Impaired fasting glucose: Secondary | ICD-10-CM | POA: Diagnosis not present

## 2019-09-14 LAB — COMPREHENSIVE METABOLIC PANEL
ALT: 35 U/L (ref 0–53)
AST: 16 U/L (ref 0–37)
Albumin: 4.5 g/dL (ref 3.5–5.2)
Alkaline Phosphatase: 41 U/L (ref 39–117)
BUN: 14 mg/dL (ref 6–23)
CO2: 29 mEq/L (ref 19–32)
Calcium: 9.7 mg/dL (ref 8.4–10.5)
Chloride: 102 mEq/L (ref 96–112)
Creatinine, Ser: 0.99 mg/dL (ref 0.40–1.50)
GFR: 78.8 mL/min (ref >60.00)
Glucose, Bld: 107 mg/dL — ABNORMAL HIGH (ref 70–99)
Potassium: 4.6 mEq/L (ref 3.5–5.1)
Sodium: 138 mEq/L (ref 135–145)
Total Bilirubin: 0.6 mg/dL (ref 0.2–1.2)
Total Protein: 6.7 g/dL (ref 6.0–8.3)

## 2019-09-14 LAB — CBC WITH DIFFERENTIAL/PLATELET
Basophils Absolute: 0 10*3/uL (ref 0.0–0.1)
Basophils Relative: 0.9 % (ref 0.0–3.0)
Eosinophils Absolute: 0.1 10*3/uL (ref 0.0–0.7)
Eosinophils Relative: 2.7 % (ref 0.0–5.0)
HCT: 49 % (ref 39.0–52.0)
Hemoglobin: 16.7 g/dL (ref 13.0–17.0)
Lymphocytes Relative: 31.9 % (ref 12.0–46.0)
Lymphs Abs: 1.7 10*3/uL (ref 0.7–4.0)
MCHC: 34 g/dL (ref 30.0–36.0)
MCV: 94 fl (ref 78.0–100.0)
Monocytes Absolute: 0.5 10*3/uL (ref 0.1–1.0)
Monocytes Relative: 8.7 % (ref 3.0–12.0)
Neutro Abs: 3 10*3/uL (ref 1.4–7.7)
Neutrophils Relative %: 55.8 % (ref 43.0–77.0)
Platelets: 223 10*3/uL (ref 150.0–400.0)
RBC: 5.22 Mil/uL (ref 4.22–5.81)
RDW: 13 % (ref 11.5–15.5)
WBC: 5.5 10*3/uL (ref 4.0–10.5)

## 2019-09-14 LAB — TSH: TSH: 1.38 u[IU]/mL (ref 0.35–4.50)

## 2019-09-14 LAB — LIPID PANEL
Cholesterol: 230 mg/dL — ABNORMAL HIGH (ref 0–200)
HDL: 48.1 mg/dL (ref >39.00)
NonHDL: 182.31
Total CHOL/HDL Ratio: 5
Triglycerides: 222 mg/dL — ABNORMAL HIGH (ref 0.0–149.0)
VLDL: 44.4 mg/dL — ABNORMAL HIGH (ref 0.0–40.0)

## 2019-09-14 LAB — HEMOGLOBIN A1C: Hgb A1c MFr Bld: 5.6 % (ref 4.6–6.5)

## 2019-09-14 LAB — PSA: PSA: 0.17 ng/mL (ref 0.10–4.00)

## 2019-09-14 LAB — LDL CHOLESTEROL, DIRECT: Direct LDL: 140 mg/dL

## 2019-09-15 ENCOUNTER — Other Ambulatory Visit: Payer: Self-pay | Admitting: Family Medicine

## 2019-09-15 ENCOUNTER — Encounter: Payer: Self-pay | Admitting: Family Medicine

## 2019-09-15 NOTE — Telephone Encounter (Signed)
Patient had labs 09/14/19, will wait for results.

## 2019-09-16 NOTE — Telephone Encounter (Signed)
PCP will discuss results at upcoming o/v. Pt has enough to last until appt.

## 2019-09-21 ENCOUNTER — Telehealth: Payer: Self-pay

## 2019-09-21 ENCOUNTER — Ambulatory Visit: Payer: BC Managed Care – PPO | Admitting: Family Medicine

## 2019-09-21 MED ORDER — ENALAPRIL MALEATE 20 MG PO TABS: 20.0000 mg | ORAL_TABLET | Freq: Every day | ORAL | 0 refills | Status: DC

## 2019-09-21 NOTE — Telephone Encounter (Signed)
Enalopril sent to pharmacy per patient request and [facility] protocol.

## 2019-09-21 NOTE — Telephone Encounter (Signed)
Patient needing refill on medication until next appt    [id number] - [place], [place] - [address]    enalapril (VASOTEC) 20 MG tablet

## 2019-09-28 ENCOUNTER — Ambulatory Visit: Payer: BC Managed Care – PPO | Admitting: Neurology

## 2019-09-28 ENCOUNTER — Encounter: Payer: Self-pay | Admitting: Neurology

## 2019-09-28 ENCOUNTER — Other Ambulatory Visit: Payer: Self-pay

## 2019-09-28 VITALS — BP 147/97 | HR 119 | Temp 96.8°F | Ht 73.0 in | Wt 256.0 lb

## 2019-09-28 DIAGNOSIS — E669 Obesity, unspecified: Secondary | ICD-10-CM

## 2019-09-28 DIAGNOSIS — K219 Gastro-esophageal reflux disease without esophagitis: Secondary | ICD-10-CM | POA: Diagnosis not present

## 2019-09-28 DIAGNOSIS — G4719 Other hypersomnia: Secondary | ICD-10-CM | POA: Insufficient documentation

## 2019-09-28 DIAGNOSIS — R0683 Snoring: Secondary | ICD-10-CM

## 2019-09-28 DIAGNOSIS — F329 Major depressive disorder, single episode, unspecified: Secondary | ICD-10-CM

## 2019-09-28 DIAGNOSIS — F419 Anxiety disorder, unspecified: Secondary | ICD-10-CM

## 2019-09-28 DIAGNOSIS — R0681 Apnea, not elsewhere classified: Secondary | ICD-10-CM

## 2019-09-28 DIAGNOSIS — F32A Depression, unspecified: Secondary | ICD-10-CM

## 2019-09-28 NOTE — Patient Instructions (Signed)

## 2019-09-28 NOTE — Progress Notes (Signed)
SLEEP MEDICINE CLINIC    Provider:  Melvyn Novas, MD  Primary Care Physician:  Jeoffrey Massed, MD [address] [place] [address]     Referring Provider: Jeoffrey Massed, Md [address] [place],  [place] [address]          Chief Complaint according to patient   Patient presents with:    . New Patient (Initial Visit)     pt states that he has never had a SS. he does admit to snoring and has woke up gasping for air his brother had OSA and feels confident his dad probably did, although was never diagnosed. he is a mouth breather. wakes up with headaches and complaints of feeling tired.       HISTORY OF PRESENT ILLNESS:  Guy Garner is a 54 y.o. year old White or Caucasian male patient and seen upon referral on 09/28/2019 from Dr Boyd Kerbs.  Chief concern according to patient :  I wake up, I snore, I have palpitations,and diaphoresis, GERD  "   I have the pleasure of seeing Guy Garner today, a right-handed White or Caucasian male with a possible sleep disorder.   She has a medical history of Adult ADHD, Anxiety and depression, Cellulitis and abscess of leg (03/2015), Facial lesion (07/2016), Hyperlipemia, mixed (07/2015; 01/2017; 09/2019), Hypertension (03/2015), IFG (impaired fasting glucose) (07/2015; 01/2017; 09/2019), Insomnia, and OSA (obstructive sleep apnea) (02/2017)    Sleep relevant medical history: Nocturia 2-4 times , GERD at night, heart palpitations, diaphoresis, gasping, choking. ADHD on adderall. Dreams of not breathing. Mouth breather.    Family medical /sleep history: brother on CPAP with OSA,  Father had been snoring , died of cerebral hemo rrhage, brother had a stroke at 71.    Social history:  Patient is working as a Social worker and lives in a household alone with 2 dogs. Family status is single . He has a girlfriend.  The patient currently works and is on his feet.  Tobacco use- none .   ETOH use- socially  / 2 a month,  Caffeine intake  in form of Coffee( 1-2 cups in AM ) Soda( 3-4 / day ) Tea ( none ) or energy drinks. Regular exercise in form of standing at work and walking. .     Sleep habits are as follows: The patient's dinner time is between 7 PM.  The patient goes to bed at 10.30 PM and continues to sleep for 2-3  hours, wakes for  bathroom breaks. The preferred sleep position is right side, with the support of 2 pillows. Dreams are reportedly rare. He wakes up a lot and plays on his phone.  6.30 AM is the usual rise time. The patient wakes up with an alarm/ or is woken by his dog. Marland Kitchen  He reports not feeling refreshed or restored in AM, with symptoms such as dry mouth , morning headaches and residual fatigue.  Naps are taken frequently, lasting from 30-120 minutes and are more refreshing than nocturnal sleep.    Review of Systems: Out of a complete 14 system review, the patient complains of only the following symptoms, and all other reviewed systems are negative.:  Fatigue, sleepiness , snoring, fragmented sleep, Insomnia, nocturia, palpitations, GERD.    How likely are you to doze in the following situations: 0 = not likely, 1 = slight chance, 2 = moderate chance, 3 = high chance   Sitting and Reading?  Watching Television? Sitting inactive in a public place (theater or meeting)? As a passenger in a car for an hour without a break? Lying down in the afternoon when circumstances permit? Sitting and talking to someone? Sitting quietly after lunch without alcohol? In a car, while stopped for a few minutes in traffic?   Total = 14/ 24 points   FSS endorsed at 44/ 63 points.   Social History   Socioeconomic History  . Marital status: Single    Spouse name: Not on file  . Number of children: Not on file  . Years of education: Not on file  . Highest education level: Not on file  Occupational History  . Not on file  Tobacco Use  . Smoking status: Never Smoker  . Smokeless tobacco: Never Used  Substance and  Sexual Activity  . Alcohol use: Yes    Comment: occ  . Drug use: No  . Sexual activity: Not on file  Other Topics Concern  . Not on file  Social History Narrative   Divorced, 1 daughter.   Occupation: Hairdresser/stylist.   No T/A/Ds.   Social Determinants of Health   Financial Resource Strain:   . Difficulty of Paying Living Expenses:   Food Insecurity:   . Worried About Programme researcher, broadcasting/film/video in the Last Year:   . Barista in the Last Year:   Transportation Needs:   . Freight forwarder (Medical):   Marland Kitchen Lack of Transportation (Non-Medical):   Physical Activity:   . Days of Exercise per Week:   . Minutes of Exercise per Session:   Stress:   . Feeling of Stress :   Social Connections:   . Frequency of Communication with Friends and Family:   . Frequency of Social Gatherings with Friends and Family:   . Attends Religious Services:   . Active Member of Clubs or Organizations:   . Attends Banker Meetings:   Marland Kitchen Marital Status:     Family History  Problem Relation Age of Onset  . Hypothyroidism Mother   . Dementia Mother   . Heart attack Father   . Diabetes Father   . Hyperlipidemia Father   . Heart disease Father   . Hypertension Father   . Diabetes Brother   . Stroke Brother   . Diabetes Maternal Grandmother   . Arthritis Paternal Grandmother   . Colon cancer Paternal Grandmother        dx mid-70's  . Stroke Paternal Grandfather     Past Medical History:  Diagnosis Date  . Adult ADHD    Managed by Dr. Elisabeth Most  . Anxiety and depression    wellbutrin and citalopram in past  . Cellulitis and abscess of leg 03/2015   Right; I&D in ED 03/31/15 (MSSA)  . Facial lesion 07/2016   irritated seb keratosis (Dr. Lupton)--cryotherapy performed.  . Hyperlipemia, mixed 07/2015; 01/2017; 09/2019   Mild (10 yr framingham risk @ 5%)-no meds indicated. Frm risk 2021 is 6%.  . Hypertension 03/2015  . IFG (impaired fasting glucose) 07/2015; 01/2017; 09/2019     Low 100-110.  A1c 5.5-5.7%.  . Insomnia    ambien in past  . OSA (obstructive sleep apnea) 02/2017   Question of: pt sent my chart message describing classic sx's---referred to [facility] in [place] at that time (02/27/17).    Past Surgical History:  Procedure Laterality Date  . Right LL venous doppler u/s Right 11/21/2018   No DVT  .  WISDOM TOOTH EXTRACTION       Current Outpatient Medications on File Prior to Visit  Medication Sig Dispense Refill  . ALPRAZolam (XANAX) 0.5 MG tablet Take 0.25 mg by mouth daily.    Marland Kitchen amphetamine-dextroamphetamine (ADDERALL XR) 30 MG 24 hr capsule Take 30 mg by mouth daily.    Marland Kitchen amphetamine-dextroamphetamine (ADDERALL) 10 MG tablet Take 10 mg by mouth daily in the afternoon.    Marland Kitchen aspirin EC 81 MG tablet Take 81 mg by mouth daily.    . citalopram (CELEXA) 20 MG tablet Take 1 tab by mouth daily.    . enalapril (VASOTEC) 20 MG tablet Take 1 tablet (20 mg total) by mouth daily. 30 tablet 0  . fluticasone (FLONASE) 50 MCG/ACT nasal spray Place into both nostrils daily.     No current facility-administered medications on file prior to visit.    Allergies  Allergen Reactions  . Amlodipine Swelling    Lower legs swelling  . Doxycycline Nausea And Vomiting    Physical exam:  Today's Vitals   09/28/19 0938  BP: (!) 147/97  Pulse: (!) 119  Temp: (!) 96.8 F (36 C)  Weight: 256 lb (116.1 kg)  Height: 6\' 1"  (1.854 m)   Body mass index is 33.78 kg/m.   Wt Readings from Last 3 Encounters:  09/28/19 256 lb (116.1 kg)  11/21/18 259 lb 3.2 oz (117.6 kg)  04/21/18 258 lb (117 kg)     Ht Readings from Last 3 Encounters:  09/28/19 6\' 1"  (1.854 m)  11/21/18 6' 1.2" (1.859 m)  04/21/18 6' 1.2" (1.859 m)      General: The patient is awake, alert and appears not in acute distress. The patient is well groomed. Head: Normocephalic, atraumatic. Neck is supple. Mallampati 2- with very red and swollen soft palate. ,  neck  circumference: 18.5 inches . Nasal airflow  patent.  Retrognathia is not seen. Facial hair.  Dental status: intact   Cardiovascular:  Regular rate and cardiac rhythm by pulse,  without distended neck veins. Respiratory: Lungs are clear to auscultation.  Skin:  Without evidence of ankle edema, or rash. Trunk: The patient's posture is erect.   Neurologic exam : The patient is awake and alert, oriented to place and time.   Memory subjective described as intact.  Attention span & concentration ability appears normal.  Speech is fluent,  without  dysarthria, dysphonia or aphasia.  Mood and affect are appropriate.   Cranial nerves: no loss of smell or taste reported  Pupils are equal and briskly reactive to light. Funduscopic exam; deferred.   Extraocular movements in vertical and horizontal planes were intact and without nystagmus. No Diplopia. Visual fields by finger perimetry are intact. Hearing was intact to tuning fork .    Facial sensation intact to fine touch. Facial motor strength is symmetric and tongue and uvula move midline.  Neck ROM : rotation, tilt and flexion extension were normal for age and shoulder shrug was symmetrical.    Motor exam:  Symmetric bulk, tone and ROM.   Normal tone without cog wheeling, symmetric grip strength .   Sensory:  Fine touch and vibration were normal.  Proprioception tested in the upper extremities was normal.   Coordination: Rapid alternating movements in the fingers/hands were of normal speed.  The Finger-to-nose maneuver was intact without evidence of dysmetria or tremor. Gait and station: Patient could rise unassisted from a seated position, walked without assistive device.  Stance is of normal width/ base and  the patient turned with 3 steps.  Toe and heel walk were deferred.  Deep tendon reflexes: in the  upper and lower extremities are symmetric and intact.  Babinski response was deferred .      The patient is fully vaccinated at this  time.  He has some comorbidities that indicate the presence of apnea or make it very likely 1 is weight gain overall with elevated BMI and a larger neck circumference.  But also the presence of hypertension which has not been ideally controlled, and his hemoglobin is elevated which may be a sign of nocturnal hypoxemia.  He is very fatigued by his own assessment and his Epworth sleepiness score is endorsed at a high score.  He does wake up with morning headaches, feels sleepy during the day and has been witnessed to snore.  He has been woken up by his own choking or gasping for air with palpitations with coughing and even with diaphoresis.  He reports also some acid reflux.  My goal would be to evaluate the patient, to screen him first for sleep apnea, but also allow an split polysomnography protocol in case that his apnea is severe or associated with severe hypoxemia.   After spending a total time of  35 minutes face to face and additional time for physical and neurologic examination, review of laboratory studies,  personal review of imaging studies, reports and results of other testing and review of referral information / records as far as provided in visit, I have established the following assessments:    My Plan is to proceed with:  1) HST for cost reasons- we will screen for apnea.    I would like to thank McGowen, Maryjean Morn, MD and Jeoffrey Massed, Md [address]  [place] [address] for allowing me to meet with and to take care of this pleasant patient.   In short, Kapono Luhn is presenting with EDS, Snoring, HTN, obesity, GERD and elevated hemoglobin.    I plan to follow up either personally or through our NP within 2-3  month.   CC: I will share my notes with his PCP.   Electronically signed by: Melvyn Novas, MD 09/28/2019 9:56 AM  [facility] Board certified by The [employer] of Sleep Medicine and Diplomate of the [employer] of Sleep Medicine. Board certified In Neurology through the ABPN, Fellow of the [employer] of Neurology. Medical Director of [facility].

## 2019-10-05 ENCOUNTER — Ambulatory Visit: Payer: BC Managed Care – PPO | Admitting: Family Medicine

## 2019-10-16 ENCOUNTER — Other Ambulatory Visit: Payer: Self-pay | Admitting: Family Medicine

## 2019-11-02 ENCOUNTER — Ambulatory Visit (INDEPENDENT_AMBULATORY_CARE_PROVIDER_SITE_OTHER): Payer: BC Managed Care – PPO | Admitting: Neurology

## 2019-11-02 DIAGNOSIS — G4733 Obstructive sleep apnea (adult) (pediatric): Secondary | ICD-10-CM

## 2019-11-02 DIAGNOSIS — K219 Gastro-esophageal reflux disease without esophagitis: Secondary | ICD-10-CM

## 2019-11-02 DIAGNOSIS — E66811 Obesity, class 1: Secondary | ICD-10-CM

## 2019-11-02 DIAGNOSIS — F419 Anxiety disorder, unspecified: Secondary | ICD-10-CM

## 2019-11-02 DIAGNOSIS — G4719 Other hypersomnia: Secondary | ICD-10-CM

## 2019-11-02 DIAGNOSIS — E669 Obesity, unspecified: Secondary | ICD-10-CM

## 2019-11-02 DIAGNOSIS — R0683 Snoring: Secondary | ICD-10-CM

## 2019-11-02 DIAGNOSIS — F32A Depression, unspecified: Secondary | ICD-10-CM

## 2019-11-02 HISTORY — PX: OTHER SURGICAL HISTORY: SHX169

## 2019-11-11 ENCOUNTER — Other Ambulatory Visit: Payer: Self-pay | Admitting: Family Medicine

## 2019-11-18 ENCOUNTER — Encounter: Payer: Self-pay | Admitting: Family Medicine

## 2019-11-18 NOTE — Progress Notes (Signed)
IMPRESSION: Moderate- Severe Sleep apnea at AHI 31.3/h and not REM sleep associated.  RECOMMENDATION: this degree of apnea can be treated best with CPAP , an autotitration device with settings from 6-16 cm water, 2 cm EPR and mask of patient's choice  under humidification is recommended.  Since there is no REM sleep dependence and no prolonged hypoxemia was noted either, an Inspire device can also be considered- the patient must reach a BMI 32 or lower for that procedure.

## 2019-11-18 NOTE — Procedures (Signed)
NAME: Guy Garner                                                               DOB: 03-Jul-1965 MEDICAL RECORD no:   [id number]                                      DOS: 11/02/19 REFERRING PHYSICIAN: Nicoletta Ba, MD STUDY PERFORMED: [facility] HISTORY: Guy Garner is a 54 year-old Caucasian male  and seen upon referral on 09/28/2019 from Dr Boyd Kerbs. Chief concern according to patient: "I wake up, I snore, I have palpitations and diaphoresis, also GERD ". [name] had a previous sleep study in 2018 in [place], [place], at [facility]. The patient endorsed the Epworth Sleepiness score at 14 points, BMI is 33.9 kg/m2. He has a medical history of elevated hemoglobin, Obesity, ADHD, Anxiety and depression, Cellulitis and abscess of leg (03/2015), Facial lesion (07/2016), Hyperlipemia, mixed (07/2015; 01/2017; 09/2019), Hypertension (03/2015), IFG (impaired fasting glucose) (07/2015; 01/2017; 09/2019), Insomnia, and OSA (obstructive sleep apnea) (02/2017)    STUDY RESULTS:  Total Recording Time: 7h, 29 min; Total Sleep Time: 6 h, 11 min. Total Apnea/Hypopnea Index (AHI): 31.3 /h; RDI: 33.4 /h; REM AHI: 28.7 /h Average Oxygen Saturation:  94 %; Lowest Oxygen Desaturation: 87 %  Total Time Oxygen Saturation Below or at 88 %:0.2 minutes  Average Heart Rate: 67 bpm IMPRESSION: Moderate- Severe Sleep apnea at AHI 31.3/h and not REM sleep associated.  RECOMMENDATION: this degree of apnea can be treated best with CPAP , an autotitration device with settings from 6-16 cm water, 2 cm EPR and mask of patient's choice  under humidification is recommended. Since there is no REM sleep dependence and no prolonged hypoxemia an Inspire device can also be considered- the patient must reach a BMI 32 or lower for that procedure.  I certify that I have reviewed the raw data recording prior to the issuance of this report in accordance with the standards of the American Academy of Sleep Medicine (AASM). Melvyn Novas, M.D.      Medical Director of [facility] at [facility], accredited by the AASM. Diplomat of the ABPN and ABSM.

## 2019-11-18 NOTE — Addendum Note (Signed)
Addended by: Melvyn Novas on: 11/18/2019 05:20 PM   Modules accepted: Orders

## 2019-11-19 ENCOUNTER — Telehealth: Payer: Self-pay | Admitting: Neurology

## 2019-11-19 NOTE — Telephone Encounter (Signed)
I called pt. I advised pt that Dr. Vickey Huger reviewed their sleep study results and found that pt has moderate to severe sleep apnea. Dr. Vickey Huger recommends that pt starts auto CPAP. I reviewed PAP compliance expectations with the pt. Pt is agreeable to starting a CPAP. I advised pt that an order will be sent to a DME, [facility] and [facility] will call the pt within about one week after they file with the pt's insurance. [facility]) will show the pt how to use the machine, fit for masks, and troubleshoot the CPAP if needed. A follow up appt was made for insurance purposes with Dr. Vickey Huger on Sept 27,2021 at 8:30 am. Pt verbalized understanding to arrive 15 minutes early and bring their CPAP. A letter with all of this information in it will be mailed to the pt as a reminder. I verified with the pt that the address we have on file is correct. Pt verbalized understanding of results. Pt had no questions at this time but was encouraged to call back if questions arise. I have sent the order to [facility]) and have received confirmation that they have received the order.

## 2019-11-19 NOTE — Telephone Encounter (Signed)
Called patient to discuss sleep study results. No answer at this time. LVM for the patient to call back.   

## 2019-11-19 NOTE — Telephone Encounter (Signed)
-----   Message from Melvyn Novas, MD sent at 11/18/2019  5:20 PM EDT ----- IMPRESSION: Moderate- Severe Sleep apnea at AHI 31.3/h and not REM sleep associated.  RECOMMENDATION: this degree of apnea can be treated best with CPAP , an autotitration device with settings from 6-16 cm water, 2 cm EPR and mask of patient's choice  under humidification is recommended.  Since there is no REM sleep dependence and no prolonged hypoxemia was noted either, an Inspire device can also be considered- the patient must reach a BMI 32 or lower for that procedure.

## 2019-11-19 NOTE — Telephone Encounter (Signed)
Pt returned the call to [facility]. Please call.

## 2019-12-07 DIAGNOSIS — F902 Attention-deficit hyperactivity disorder, combined type: Secondary | ICD-10-CM | POA: Diagnosis not present

## 2019-12-07 DIAGNOSIS — Z79899 Other long term (current) drug therapy: Secondary | ICD-10-CM | POA: Diagnosis not present

## 2019-12-07 DIAGNOSIS — F419 Anxiety disorder, unspecified: Secondary | ICD-10-CM | POA: Diagnosis not present

## 2019-12-07 NOTE — Telephone Encounter (Signed)
I have also sent a message to [facility] advising them to contact the patient.

## 2019-12-07 NOTE — Telephone Encounter (Signed)
Pt called to let nurse know have not received CPAP from [facility]. Pt called [facility] 7/19 and LVM and [facility] have not responded.  I informed Pt to call [facility] again this week.

## 2019-12-18 ENCOUNTER — Other Ambulatory Visit: Payer: Self-pay | Admitting: Family Medicine

## 2019-12-18 MED ORDER — CEPHALEXIN 500 MG PO CAPS: 500.0000 mg | ORAL_CAPSULE | Freq: Three times a day (TID) | ORAL | 0 refills | Status: DC

## 2019-12-29 DIAGNOSIS — G4733 Obstructive sleep apnea (adult) (pediatric): Secondary | ICD-10-CM | POA: Diagnosis not present

## 2020-01-29 DIAGNOSIS — G4733 Obstructive sleep apnea (adult) (pediatric): Secondary | ICD-10-CM | POA: Diagnosis not present

## 2020-02-04 ENCOUNTER — Telehealth: Payer: Self-pay | Admitting: Neurology

## 2020-02-04 NOTE — Telephone Encounter (Signed)
Called the patient, we pushed apt out to 10/11 to allow more time to get used to the machine. I have reached out to [facility] for some concerns he is having.

## 2020-02-08 ENCOUNTER — Ambulatory Visit: Payer: BC Managed Care – PPO | Admitting: Neurology

## 2020-02-10 ENCOUNTER — Other Ambulatory Visit: Payer: Self-pay | Admitting: Family Medicine

## 2020-02-18 NOTE — Telephone Encounter (Signed)
Pt has called to report that he is paying out of pocket for the devise.  Pt has decided to hold off on scheduling an initial CPAP f/u at this time due to him not being able to use the CPAP because of it leaking with all tried mask.  Pt ha reached out to [facility] with no resolution as of yet. Pt will call back at a later time to r/s. This is Financial planner

## 2020-02-18 NOTE — Telephone Encounter (Signed)
Called the patient back. There was no answer. Left a detailed message advising the patient that his initial CPAP visit needs to be before 03/30/2020. That is when his 90 days end.  I also advised that in order for insurance to continue to cover their portion of the machine and supplies he has to be compliant by using the machine > 4 hrs each night. As of today he is not compliant and really should push apt out anyway to work on compliance. Advised that for further questions on that matter, he can contact the company and they can discuss further with them since they are the ones who bill for the machine.   IF pt calls back please schedule him with MD, NP (at this point anyone who has an opening) to make sure that he gets seen by 03/30/20.

## 2020-02-18 NOTE — Telephone Encounter (Signed)
Pt called stating that he can not make it on the 11th. Pt states that he is only available on Monday's and on the 19th or 20th of Oct. No available appts on those days. Pt would also like to discuss why he has to be in compliance with the insurance if he is paying for this machine. Please advise.

## 2020-02-18 NOTE — Telephone Encounter (Signed)
I have forwarded the message to [facility]) for them to reach out to the pt and assist with a mask refit.

## 2020-02-22 ENCOUNTER — Encounter: Payer: Self-pay | Admitting: Family Medicine

## 2020-02-22 ENCOUNTER — Ambulatory Visit: Payer: BC Managed Care – PPO | Admitting: Family Medicine

## 2020-02-22 ENCOUNTER — Other Ambulatory Visit: Payer: Self-pay

## 2020-02-22 ENCOUNTER — Ambulatory Visit: Payer: BC Managed Care – PPO | Admitting: Adult Health

## 2020-02-22 VITALS — BP 156/100 | HR 93 | Temp 97.6°F | Resp 16 | Ht 73.0 in | Wt 260.0 lb

## 2020-02-22 DIAGNOSIS — I10 Essential (primary) hypertension: Secondary | ICD-10-CM | POA: Diagnosis not present

## 2020-02-22 DIAGNOSIS — L03119 Cellulitis of unspecified part of limb: Secondary | ICD-10-CM

## 2020-02-22 DIAGNOSIS — L03115 Cellulitis of right lower limb: Secondary | ICD-10-CM

## 2020-02-22 LAB — CBC WITH DIFFERENTIAL/PLATELET
Basophils Absolute: 0 10*3/uL (ref 0.0–0.1)
Basophils Relative: 0.6 % (ref 0.0–3.0)
Eosinophils Absolute: 0.1 10*3/uL (ref 0.0–0.7)
Eosinophils Relative: 1.7 % (ref 0.0–5.0)
HCT: 49.8 % (ref 39.0–52.0)
Hemoglobin: 17.1 g/dL — ABNORMAL HIGH (ref 13.0–17.0)
Lymphocytes Relative: 23.8 % (ref 12.0–46.0)
Lymphs Abs: 1.7 10*3/uL (ref 0.7–4.0)
MCHC: 34.4 g/dL (ref 30.0–36.0)
MCV: 94 fl (ref 78.0–100.0)
Monocytes Absolute: 0.6 10*3/uL (ref 0.1–1.0)
Monocytes Relative: 8.7 % (ref 3.0–12.0)
Neutro Abs: 4.8 10*3/uL (ref 1.4–7.7)
Neutrophils Relative %: 65.2 % (ref 43.0–77.0)
Platelets: 217 10*3/uL (ref 150.0–400.0)
RBC: 5.3 Mil/uL (ref 4.22–5.81)
RDW: 12.8 % (ref 11.5–15.5)
WBC: 7.3 10*3/uL (ref 4.0–10.5)

## 2020-02-22 LAB — C-REACTIVE PROTEIN: CRP: <1 mg/dL (ref 0.5–20.0)

## 2020-02-22 LAB — SEDIMENTATION RATE: Sed Rate: 1 mm/hr (ref 0–20)

## 2020-02-22 MED ORDER — CEPHALEXIN 500 MG PO CAPS: 500.0000 mg | ORAL_CAPSULE | Freq: Three times a day (TID) | ORAL | 0 refills | Status: AC

## 2020-02-22 NOTE — Telephone Encounter (Signed)
Patient was contacted and scheduled for appt. He was seen today

## 2020-02-22 NOTE — Progress Notes (Addendum)
OFFICE VISIT  02/22/2020  CC:  Chief Complaint  Patient presents with  . Foot Swelling    right, occuring for 6 days now. Has been using ibuprofen and using ice for minor relief   HPI:    Patient is a 54 y.o. Caucasian male who presents for foot swelling.  Onset 1 week ago, R top of foot medially, turning red and swelling some.  Redness worsening last 3-4d.  Says shoe was too tight the day he noticed it starting.  No fever/chills/malaise.   Has had recurrent LL cellulitis, each leg has been involved.  R infection site cultured and was MSSA. His infections have cleared with keflex. Never on R foot. Has bilat (R>L) LE venous insufficiency edema with freckling skin changes.   Last home bp check was 1 mo ago: 123/84.  Past Medical History:  Diagnosis Date  . Adult ADHD    Managed by Dr. Johnnye Sima  . Anxiety and depression    wellbutrin and citalopram in past  . Cellulitis and abscess of leg 03/2015   Right; I&D in ED 03/31/15 (MSSA)  . Facial lesion 07/2016   irritated seb keratosis (Dr. Lupton)--cryotherapy performed.  . Hyperlipemia, mixed 07/2015; 01/2017; 09/2019   Mild (10 yr framingham risk @ 5%)-no meds indicated. Frm risk 2021 is 6%.  . Hypertension 03/2015  . IFG (impaired fasting glucose) 07/2015; 01/2017; 09/2019   Low 100-110.  A1c 5.5-5.7%.  . Insomnia    ambien in past  . OSA (obstructive sleep apnea) 2021   11/02/19 home sleep study: mod-to-sev, needs CPAP    Past Surgical History:  Procedure Laterality Date  . HOME SLEEP STUDY  11/02/2019   Dr. Emeline Gins OSA->cpap recommended  . Right LL venous doppler u/s Right 11/21/2018   No DVT  . WISDOM TOOTH EXTRACTION      Outpatient Medications Prior to Visit  Medication Sig Dispense Refill  . aspirin EC 81 MG tablet Take 81 mg by mouth daily.    . citalopram (CELEXA) 20 MG tablet Take 1 tab by mouth daily.    . enalapril (VASOTEC) 20 MG tablet TAKE 1 TABLET BY MOUTH EVERY DAY 90 tablet 0  . MYDAYIS  37.5 MG CP24 Take 1 capsule by mouth daily.    Marland Kitchen ALPRAZolam (XANAX) 0.5 MG tablet Take 0.25 mg by mouth daily. (Patient not taking: Reported on 02/22/2020)    . fluticasone (FLONASE) 50 MCG/ACT nasal spray Place into both nostrils daily. (Patient not taking: Reported on 02/22/2020)    . amphetamine-dextroamphetamine (ADDERALL XR) 30 MG 24 hr capsule Take 30 mg by mouth daily. (Patient not taking: Reported on 02/22/2020)    . amphetamine-dextroamphetamine (ADDERALL) 10 MG tablet Take 10 mg by mouth daily in the afternoon. (Patient not taking: Reported on 02/22/2020)    . cephALEXin (KEFLEX) 500 MG capsule Take 1 capsule (500 mg total) by mouth 3 (three) times daily. (Patient not taking: Reported on 02/22/2020) 21 capsule 0   No facility-administered medications prior to visit.    Allergies  Allergen Reactions  . Amlodipine Swelling    Lower legs swelling  . Doxycycline Nausea And Vomiting    ROS As per HPI  PE: Vitals with BMI 02/22/2020 09/28/2019 08/24/2019  Height 6' 1" 6' 1" -  Weight 260 lbs 256 lbs -  BMI 54.09 81.19 -  Systolic 147 829 562  Diastolic 130 97 88  Pulse 93 119 67  Manual bp repeat 128/76   Gen: Alert, well appearing.  Patient is oriented  to person, place, time, and situation. AFFECT: pleasant, lucid thought and speech. R LL with 3+ pitting edema from mid tibia level down into entire top of R foot. L LL with 1 + pitting edema from mid tibia level down to ankle: none on foot. R foot with fairly focal erythema, large oval splotch with indistinct borders, +blanching, + hint of fluctuance beneath the center-most portion of the erythema.  There is similar, smaller area of erythema near medial malleolus. Punctate scab on top of R foot SEPARATE from the area of erythema.  +TTP over area of erythema, + warmth.  No streaking.  No ulceration or weeping.  LABS:    Chemistry      Component Value Date/Time   NA 138 09/14/2019 0808   K 4.6 09/14/2019 0808   CL 102  09/14/2019 0808   CO2 29 09/14/2019 0808   BUN 14 09/14/2019 0808   CREATININE 0.99 09/14/2019 0808      Component Value Date/Time   CALCIUM 9.7 09/14/2019 0808   ALKPHOS 41 09/14/2019 0808   AST 16 09/14/2019 0808   ALT 35 09/14/2019 0808   BILITOT 0.6 09/14/2019 0808     Lab Results  Component Value Date   WBC 5.5 09/14/2019   HGB 16.7 09/14/2019   HCT 49.0 09/14/2019   MCV 94.0 09/14/2019   PLT 223.0 09/14/2019    IMPRESSION AND PLAN:  1) Acute cellulitis R foot dorsal aspect.  Hx of MSSA infection R calf, and similar lesion L calf separate time period---all responded to keflex.   Recurrent cellulitis (with ulceration/drainage) of LL's (one episode on each side), now with his first R foot episode. This is in the context of chronic LE venous insufficiency edema, R>L. We'll get him back on keflex x 10d. Check CBC, ESR, CRP. Consideration in future: skin bx; culture of and draining contents.  (other ddx: ? Vasculitis, ? Erythema nodosum?--but these conditions would usually involve signs of systemic illness, have multiple lesions at the same time, and not resolve with use of antibiotic like his have.  2) HTN: good control. Initial bp here today up---pt was a little flustered due to having a tough time checking in at the desk today.  Last BMET 10/02/19 was good.  An After Visit Summary was printed and given to the patient.  FOLLOW UP: Return if symptoms worsen or fail to improve.  Signed:  Phil McGowen, MD           02/22/2020    

## 2020-02-25 ENCOUNTER — Encounter: Payer: Self-pay | Admitting: Family Medicine

## 2020-02-25 DIAGNOSIS — L03119 Cellulitis of unspecified part of limb: Secondary | ICD-10-CM

## 2020-02-26 NOTE — Telephone Encounter (Signed)
Please enter referral as discussed.

## 2020-02-26 NOTE — Telephone Encounter (Signed)
Patient was last seen on 10/11 regarding this issue. Given keflex x10d.  Please advise, thanks.

## 2020-02-26 NOTE — Telephone Encounter (Signed)
OK referral to infectious disease has been ordered.

## 2020-02-28 DIAGNOSIS — G4733 Obstructive sleep apnea (adult) (pediatric): Secondary | ICD-10-CM | POA: Diagnosis not present

## 2020-02-29 ENCOUNTER — Encounter: Payer: Self-pay | Admitting: Family Medicine

## 2020-03-03 ENCOUNTER — Encounter: Payer: Self-pay | Admitting: Family Medicine

## 2020-03-03 NOTE — Telephone Encounter (Signed)
FYI  Please see below

## 2020-03-07 ENCOUNTER — Ambulatory Visit: Payer: BC Managed Care – PPO | Admitting: Family Medicine

## 2020-03-08 ENCOUNTER — Encounter: Payer: Self-pay | Admitting: Internal Medicine

## 2020-03-08 ENCOUNTER — Ambulatory Visit (INDEPENDENT_AMBULATORY_CARE_PROVIDER_SITE_OTHER): Payer: BC Managed Care – PPO | Admitting: Internal Medicine

## 2020-03-08 ENCOUNTER — Other Ambulatory Visit: Payer: Self-pay

## 2020-03-08 VITALS — BP 147/92 | HR 59 | Temp 97.9°F | Wt 260.0 lb

## 2020-03-08 DIAGNOSIS — L03115 Cellulitis of right lower limb: Secondary | ICD-10-CM | POA: Diagnosis not present

## 2020-03-08 DIAGNOSIS — Z23 Encounter for immunization: Secondary | ICD-10-CM | POA: Diagnosis not present

## 2020-03-08 DIAGNOSIS — L0390 Cellulitis, unspecified: Secondary | ICD-10-CM

## 2020-03-08 DIAGNOSIS — B9561 Methicillin susceptible Staphylococcus aureus infection as the cause of diseases classified elsewhere: Secondary | ICD-10-CM

## 2020-03-08 MED ORDER — CEPHALEXIN 500 MG PO CAPS: 500.0000 mg | ORAL_CAPSULE | Freq: Every day | ORAL | 2 refills | Status: DC

## 2020-03-08 MED ORDER — UREA 10 % EX CREA: TOPICAL_CREAM | CUTANEOUS | 0 refills | Status: DC | PRN

## 2020-03-08 MED ORDER — EUCERIN EX CREA: TOPICAL_CREAM | CUTANEOUS | 0 refills | Status: DC | PRN

## 2020-03-08 NOTE — Progress Notes (Signed)
[facility]  Reason for Consult:recurrent cellulitis Referring Provider:      Patient Active Problem List   Diagnosis Date Noted  . Excessive daytime sleepiness 09/28/2019  . GERD with apnea 09/28/2019  . Snoring 09/28/2019  . Obesity (BMI 30.0-34.9) 04/21/2018  . Anxiety and depression   . Hypertension 03/15/2015    Patient's Medications  New Prescriptions   No medications on file  Previous Medications   ALPRAZOLAM (XANAX) 0.5 MG TABLET    Take 0.25 mg by mouth daily.    ASPIRIN EC 81 MG TABLET    Take 81 mg by mouth daily.   CITALOPRAM (CELEXA) 20 MG TABLET    Take 1 tab by mouth daily.   ENALAPRIL (VASOTEC) 20 MG TABLET    TAKE 1 TABLET BY MOUTH EVERY DAY   FLUTICASONE (FLONASE) 50 MCG/ACT NASAL SPRAY    Place into both nostrils daily.    LORATADINE (CLARITIN) 10 MG TABLET    Take 10 mg by mouth daily.   MYDAYIS 37.5 MG CP24    Take 1 capsule by mouth daily.  Modified Medications   No medications on file  Discontinued Medications   No medications on file    HPI: Guy Garner is a 54 y.o. male healthy otherwise referred for recurrent cellulitis  3 episodes he remembers:000 5 years ago right calf carbuncle 2-3 months ago left calf A couple weeks ago right foot  All started with a "bite" of some sort then turned into abscess. 5 years ago required drainage.  All three episodes treated with cephalexin and had improved  Still the right foot is recovering. Had 10 days cephalexin. Last pill a week prior to this visit   Guy Garner so on foot all days Dry/cracked heels Dry skin Noticed mild swelling LE end of days  He said 5 years ago the right leg wound grew mssa, not mrsa. And he denies beign told before about mrsa colonization   We discussed mrsa vs mssa coverage and inpatient vs outpatient prevention/decolonization vs abx prophy for recurrent cellulitis>  Patient doesn't want nares topical/chlorhexidine solution at this  time  Wants prophylactic abx   Review of Systems: ROS Negative 11 point ros unless mentioned above   Past Medical History:  Diagnosis Date  . Adult ADHD    Managed by Dr. Elisabeth Garner  . Anxiety and depression    wellbutrin and citalopram in past  . Cellulitis and abscess of leg 03/2015   Right; I&D in ED 03/31/15 (MSSA)  . Facial lesion 07/2016   irritated seb keratosis (Dr. Lupton)--cryotherapy performed.  . Hyperlipemia, mixed 07/2015; 01/2017; 09/2019   Mild (10 yr framingham risk @ 5%)-no meds indicated. Frm risk 2021 is 6%.  . Hypertension 03/2015  . IFG (impaired fasting glucose) 07/2015; 01/2017; 09/2019   Low 100-110.  A1c 5.5-5.7%.  . Insomnia    ambien in past  . OSA (obstructive sleep apnea) 2021   11/02/19 home sleep study: mod-to-sev, needs CPAP    Social History   Tobacco Use  . Smoking status: Never Smoker  . Smokeless tobacco: Never Used  Vaping Use  . Vaping Use: Never used  Substance Use Topics  . Alcohol use: Yes    Comment: occ  . Drug use: No    Family History  Problem Relation Age of Onset  . Hypothyroidism Mother   . Dementia Mother   . Heart attack Father   . Diabetes Father   .  Hyperlipidemia Father   . Heart disease Father   . Hypertension Father   . Diabetes Brother   . Stroke Brother   . Diabetes Maternal Grandmother   . Arthritis Paternal Grandmother   . Colon cancer Paternal Grandmother        dx mid-70's  . Stroke Paternal Grandfather    Allergies  Allergen Reactions  . Amlodipine Swelling    Lower legs swelling  . Doxycycline Nausea And Vomiting    OBJECTIVE: Vitals:   03/08/20 0933  BP: (!) 147/92  Pulse: (!) 59  Temp: 97.9 F (36.6 C)  TempSrc: Oral  Weight: 260 lb (117.9 kg)   Body mass index is 34.3 kg/m.   Physical Exam Constitutional:      Appearance: Normal appearance.  HENT:     Head: Normocephalic.     Right Ear: External ear normal.     Left Ear: External ear normal.     Nose: Nose normal.      Mouth/Throat:     Mouth: Mucous membranes are moist.  Eyes:     Extraocular Movements: Extraocular movements intact.     Conjunctiva/sclera: Conjunctivae normal.     Pupils: Pupils are equal, round, and reactive to light.  Cardiovascular:     Rate and Rhythm: Normal rate.     Heart sounds: Normal heart sounds.  Pulmonary:     Effort: Pulmonary effort is normal.     Breath sounds: Normal breath sounds.  Abdominal:     General: There is no distension.     Palpations: Abdomen is soft.     Tenderness: There is no abdominal tenderness.  Musculoskeletal:        General: Normal range of motion.  Lymphadenopathy:     Cervical: No cervical adenopathy.  Skin:    General: Skin is warm.     Capillary Refill: Capillary refill takes less than 2 seconds.     Comments: Right foot 2 serosanguinous draining tract and mild superimposed erythema without fluctuance. Mild tenderness  Right LE medial calf old atrophic scar  Neurological:     General: No focal deficit present.  Psychiatric:        Mood and Affect: Mood normal.      Microbiology: No results found for this or any previous visit (from the past 240 hour(s)).     Assessment/plan: Recurrent cellulitis. Seems staph aureus process No known hx mrsa  Discussed more evidence of decolonization for mrsa in the inpatient setting. Not too clear about outpatient's benefit  Will check to see if he is colonized with mrsa.   Offered decolonization but he prefer prophy abx, seems reasonable   -would treat now for another 7 days prior to moving to daily cephalexin 500 mg suppression -mrsa nares    Patient Active Problem List   Diagnosis Date Noted  . Excessive daytime sleepiness 09/28/2019  . GERD with apnea 09/28/2019  . Snoring 09/28/2019  . Obesity (BMI 30.0-34.9) 04/21/2018  . Anxiety and depression   . Hypertension 03/15/2015     Problem List Items Addressed This Visit    None    Visit Diagnoses    Need for immunization  against influenza    -  Primary   Relevant Orders   Flu Vaccine QUAD 36+ mos IM (Completed)   Recurrent cellulitis           I am having Guy Garner maintain his fluticasone, aspirin EC, citalopram, ALPRAZolam, enalapril, Mydayis, and loratadine.   No orders of the  defined types were placed in this encounter.    Follow-up: Return in about 3 months (around 06/08/2020) for antibiotics monitoring.  Raymondo Band, MD [facility] -- -- pager   [phone] cell 03/08/2020, 10:17 AM

## 2020-03-08 NOTE — Patient Instructions (Signed)
You seem to have recurrent staph aureus cellulitis  MRSA is a possibility but the fact it gets better on cephalexin (not mrsa coverage) suggest otherwise.  Now you could still have mrsa coverage. So checking for its presence could help management decision in the future    Plan: -lab ordered for MRSA in your nares screening -take cephalexin 500 mg twice a day for another week then once day indefinitely for prevention -follow up in 3 months  -if rash is worse the next several days can come in sooner  -agree with compression stocking, epson's salt -for thickening of skin, can use urea cream then eucerin cream once skin gets less thickened.

## 2020-03-12 LAB — NASAL CULTURE (N/P)
MICRO NUMBER:: 11121188
SPECIMEN QUALITY:: ADEQUATE

## 2020-03-28 ENCOUNTER — Ambulatory Visit: Payer: BC Managed Care – PPO | Admitting: Family Medicine

## 2020-03-30 DIAGNOSIS — G4733 Obstructive sleep apnea (adult) (pediatric): Secondary | ICD-10-CM | POA: Diagnosis not present

## 2020-04-11 ENCOUNTER — Ambulatory Visit: Payer: BC Managed Care – PPO | Admitting: Family Medicine

## 2020-05-09 ENCOUNTER — Ambulatory Visit: Payer: BC Managed Care – PPO | Admitting: Family Medicine

## 2020-05-14 ENCOUNTER — Other Ambulatory Visit: Payer: Self-pay | Admitting: Family Medicine

## 2020-05-31 ENCOUNTER — Encounter: Payer: Self-pay | Admitting: Neurology

## 2020-06-06 ENCOUNTER — Ambulatory Visit: Payer: BC Managed Care – PPO | Admitting: Family Medicine

## 2020-06-27 ENCOUNTER — Other Ambulatory Visit: Payer: Self-pay

## 2020-06-27 ENCOUNTER — Encounter: Payer: Self-pay | Admitting: Family Medicine

## 2020-06-27 ENCOUNTER — Ambulatory Visit: Payer: BC Managed Care – PPO | Admitting: Family Medicine

## 2020-06-27 ENCOUNTER — Ambulatory Visit (INDEPENDENT_AMBULATORY_CARE_PROVIDER_SITE_OTHER)
Admission: RE | Admit: 2020-06-27 | Discharge: 2020-06-27 | Disposition: A | Discharge status: Home / Self Care | Payer: BC Managed Care – PPO | Source: Ambulatory Visit | Attending: Family Medicine | Admitting: Family Medicine

## 2020-06-27 ENCOUNTER — Ambulatory Visit: Payer: BC Managed Care – PPO | Admitting: Internal Medicine

## 2020-06-27 DIAGNOSIS — Z79899 Other long term (current) drug therapy: Secondary | ICD-10-CM | POA: Diagnosis not present

## 2020-06-27 DIAGNOSIS — F419 Anxiety disorder, unspecified: Secondary | ICD-10-CM | POA: Diagnosis not present

## 2020-06-27 DIAGNOSIS — F902 Attention-deficit hyperactivity disorder, combined type: Secondary | ICD-10-CM | POA: Diagnosis not present

## 2020-06-27 DIAGNOSIS — S6990XA Unspecified injury of unspecified wrist, hand and finger(s), initial encounter: Secondary | ICD-10-CM | POA: Diagnosis not present

## 2020-06-27 DIAGNOSIS — S6991XA Unspecified injury of right wrist, hand and finger(s), initial encounter: Secondary | ICD-10-CM | POA: Diagnosis not present

## 2020-06-27 MED ORDER — IBUPROFEN 800 MG PO TABS: 800.0000 mg | ORAL_TABLET | Freq: Three times a day (TID) | ORAL | 0 refills | Status: DC | PRN

## 2020-06-27 NOTE — Patient Instructions (Addendum)
Be cautious using your hand /protect your thumb (wrap it if that helps)   Xray now  Cold compress as needed if tolerated   Try the ibuprofen 800 mg up to three times daily with food  Tylenol is ok  Elevate hand when you can   We will contact you with your xray report and plan

## 2020-06-27 NOTE — Assessment & Plan Note (Signed)
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

## 2020-06-27 NOTE — Progress Notes (Signed)
Subjective:    Patient ID: Guy Garner, male    DOB: [birth year], 55 y.o.   MRN: [id number]  This visit occurred during the SARS-CoV-2 public health emergency.  Safety protocols were in place, including screening questions prior to the visit, additional usage of staff PPE, and extensive cleaning of exam room while observing appropriate contact time as indicated for disinfecting solutions.    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 Thumb injury, initial encounter 06/27/2020  . Excessive daytime sleepiness 09/28/2019  . GERD with apnea 09/28/2019  . Snoring 09/28/2019  . Obesity (BMI 30.0-34.9) 04/21/2018  . Anxiety and depression   . Hypertension 03/15/2015   Past Medical History:  Diagnosis Date  . Adult ADHD    Managed by Dr. Elisabeth Most  . Anxiety and depression    wellbutrin and citalopram in past  . Cellulitis and abscess of leg 03/2015   Right; I&D in ED 03/31/15 (MSSA)  . Facial lesion 07/2016   irritated seb keratosis (Dr. Lupton)--cryotherapy performed.  . Hyperlipemia,  mixed 07/2015; 01/2017; 09/2019   Mild (10 yr framingham risk @ 5%)-no meds indicated. Frm risk 2021 is 6%.  . Hypertension 03/2015  . IFG (impaired fasting glucose) 07/2015; 01/2017; 09/2019   Low 100-110.  A1c 5.5-5.7%.  . Insomnia    ambien in past  . OSA (obstructive sleep apnea) 2021   11/02/19 home sleep study: mod-to-sev, needs CPAP   Past Surgical History:  Procedure Laterality Date  . HOME SLEEP STUDY  11/02/2019   Dr. Kathlen Brunswick OSA->cpap recommended  . Right LL venous doppler u/s Right 11/21/2018   No DVT  . WISDOM TOOTH EXTRACTION     Social History   Tobacco Use  . Smoking status: Never Smoker  . Smokeless tobacco: Never Used  Vaping Use  . Vaping Use: Never used  Substance Use Topics  . Alcohol use: Yes    Comment: occ  . Drug use: No   Family History  Problem Relation Age of Onset  . Hypothyroidism Mother   . Dementia Mother   . Heart attack Father   . Diabetes Father   . Hyperlipidemia Father   . Heart disease Father   . Hypertension Father   . Diabetes Brother   . Stroke Brother   . Diabetes Maternal Grandmother   . Arthritis Paternal Grandmother   . Colon cancer Paternal Grandmother  dx mid-70's  . Stroke Paternal Grandfather    Allergies  Allergen Reactions  . Amlodipine Swelling    Lower legs swelling  . Doxycycline Nausea And Vomiting   Current Outpatient Medications on File Prior to Visit  Medication Sig Dispense Refill  . ALPRAZolam (XANAX) 0.5 MG tablet Take 0.25 mg by mouth daily.     Marland Kitchen aspirin EC 81 MG tablet Take 81 mg by mouth daily.    . citalopram (CELEXA) 20 MG tablet Take 1 tab by mouth daily.    . enalapril (VASOTEC) 20 MG tablet TAKE 1 TABLET BY MOUTH EVERY DAY 90 tablet 0  . fluticasone (FLONASE) 50 MCG/ACT nasal spray Place into both nostrils daily.     Marland Kitchen loratadine (CLARITIN) 10 MG tablet Take 10 mg by mouth daily.    Marland Kitchen MYDAYIS 37.5 MG CP24 Take 1 capsule by mouth daily.    . Skin Protectants, Misc. (EUCERIN)  cream Apply topically as needed for dry skin. 454 g 0  . urea (CARMOL) 10 % cream Apply topically as needed. 71 g 0   No current facility-administered medications on file prior to visit.      Review of Systems  Constitutional: Negative for activity change, appetite change, fatigue, fever and unexpected weight change.  HENT: Negative for congestion, rhinorrhea, sore throat and trouble swallowing.   Eyes: Negative for pain, redness, itching and visual disturbance.  Respiratory: Negative for cough, chest tightness, shortness of breath and wheezing.   Cardiovascular: Negative for chest pain and palpitations.  Gastrointestinal: Negative for abdominal pain, blood in stool, constipation, diarrhea and nausea.  Endocrine: Negative for cold intolerance, heat intolerance, polydipsia and polyuria.  Genitourinary: Negative for difficulty urinating, dysuria, frequency and urgency.  Musculoskeletal: Positive for arthralgias. Negative for joint swelling and myalgias.       Right thumb swelling and pain  Skin: Negative for pallor and rash.  Neurological: Negative for dizziness, tremors, weakness, numbness and headaches.  Hematological: Negative for adenopathy. Does not bruise/bleed easily.  Psychiatric/Behavioral: Negative for decreased concentration and dysphoric mood. The patient is not nervous/anxious.        Objective:   Physical Exam Constitutional:      General: He is not in acute distress.    Appearance: Normal appearance. He is obese. He is not ill-appearing.  Eyes:     Conjunctiva/sclera: Conjunctivae normal.     Pupils: Pupils are equal, round, and reactive to light.  Cardiovascular:     Rate and Rhythm: Normal rate and regular rhythm.  Pulmonary:     Effort: Pulmonary effort is normal. No respiratory distress.  Musculoskeletal:        General: Swelling and tenderness present.     Right hand: Swelling, tenderness and bony tenderness present. No deformity. Normal strength. Normal  sensation. Normal capillary refill. Normal pulse.     Comments: R thumb is swollen distally without redness Mildly warm  Tender to touch distally and over PIP joint  Small amt of hematoma under nail (tender but not exquisitely) Nl sensation and perfusion  Able to bend metacarpal-phalanx joint with discomfort Nl rom of carpal-metacarpal joint    Skin:    Comments: Nail deformity of R thumb is baseline per pt from old trauma (nail is slightly scooped and bumpy)  Small hematoma evident under that nail  Neurological:     Mental Status: He is alert.     Sensory: No sensory deficit.     Motor: No weakness.  Psychiatric:  Mood and Affect: Mood normal.           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

## 2020-07-01 ENCOUNTER — Other Ambulatory Visit: Payer: Self-pay

## 2020-07-01 ENCOUNTER — Encounter: Payer: Self-pay | Admitting: Family Medicine

## 2020-07-01 ENCOUNTER — Ambulatory Visit (INDEPENDENT_AMBULATORY_CARE_PROVIDER_SITE_OTHER): Payer: BC Managed Care – PPO | Admitting: Family Medicine

## 2020-07-01 VITALS — BP 149/77 | HR 87 | Temp 97.4°F | Resp 16 | Ht 71.0 in | Wt 263.8 lb

## 2020-07-01 DIAGNOSIS — S6010XD Contusion of unspecified finger with damage to nail, subsequent encounter: Secondary | ICD-10-CM

## 2020-07-01 MED ORDER — MUPIROCIN 2 % EX OINT: 1.0000 "application " | TOPICAL_OINTMENT | Freq: Three times a day (TID) | CUTANEOUS | 0 refills | Status: DC

## 2020-07-01 MED ORDER — HYDROCODONE-ACETAMINOPHEN 5-325 MG PO TABS
1.0000 | ORAL_TABLET | ORAL | 0 refills | Status: DC | PRN
Start: 2020-07-01 — End: 2020-07-01

## 2020-07-01 MED ORDER — HYDROCODONE-ACETAMINOPHEN 5-325 MG PO TABS
1.0000 | ORAL_TABLET | ORAL | 0 refills | Status: DC | PRN
Start: 2020-07-01 — End: 2020-10-24

## 2020-07-01 MED ORDER — CEPHALEXIN 500 MG PO CAPS: 500.0000 mg | ORAL_CAPSULE | Freq: Three times a day (TID) | ORAL | 0 refills | Status: DC

## 2020-07-01 NOTE — Telephone Encounter (Signed)
Pls call pt or contact via mychart to get him on my schedule this afternoon at 2:30 in person or 4 oclock in person---for evaluation of thumb pain.-thx

## 2020-07-01 NOTE — Progress Notes (Signed)
OFFICE VISIT  07/01/2020  CC:  Chief Complaint  Patient presents with  . Thumb pain    HPI:    Patient is a 55 y.o. Caucasian male who presents for ongoing R thumb pain. He jammed his R thumb against a drawer 06/24/20 (he actually also did similar injury a few days prior to same thumb), Dr. Milinda Antis saw him for this on 2/14 and got an x-ray->no fracture or dislocation, soft tissues unremarkable.  Ibup regularly but not helping. Ongoing severe throbbing pain preventing sleep and work.  Sent pic on mychart showing black and swollen thumb.   Past Medical History:  Diagnosis Date  . Adult ADHD    Managed by Dr. Elisabeth Most  . Anxiety and depression    wellbutrin and citalopram in past  . Cellulitis and abscess of leg 03/2015   Right; I&D in ED 03/31/15 (MSSA)  . Facial lesion 07/2016   irritated seb keratosis (Dr. Lupton)--cryotherapy performed.  . Hyperlipemia, mixed 07/2015; 01/2017; 09/2019   Mild (10 yr framingham risk @ 5%)-no meds indicated. Frm risk 2021 is 6%.  . Hypertension 03/2015  . IFG (impaired fasting glucose) 07/2015; 01/2017; 09/2019   Low 100-110.  A1c 5.5-5.7%.  . Insomnia    ambien in past  . OSA (obstructive sleep apnea) 2021   11/02/19 home sleep study: mod-to-sev, needs CPAP    Past Surgical History:  Procedure Laterality Date  . HOME SLEEP STUDY  11/02/2019   Dr. Kathlen Brunswick OSA->cpap recommended  . Right LL venous doppler u/s Right 11/21/2018   No DVT  . WISDOM TOOTH EXTRACTION      Outpatient Medications Prior to Visit  Medication Sig Dispense Refill  . ALPRAZolam (XANAX) 0.5 MG tablet Take 0.25 mg by mouth daily.     Marland Kitchen aspirin EC 81 MG tablet Take 81 mg by mouth daily.    . citalopram (CELEXA) 20 MG tablet Take 1 tab by mouth daily.    . enalapril (VASOTEC) 20 MG tablet TAKE 1 TABLET BY MOUTH EVERY DAY 90 tablet 0  . fluticasone (FLONASE) 50 MCG/ACT nasal spray Place into both nostrils daily.     Marland Kitchen ibuprofen (ADVIL) 800 MG tablet Take 1 tablet  (800 mg total) by mouth every 8 (eight) hours as needed. With food 60 tablet 0  . loratadine (CLARITIN) 10 MG tablet Take 10 mg by mouth daily.    Marland Kitchen MYDAYIS 37.5 MG CP24 Take 1 capsule by mouth daily.    . Skin Protectants, Misc. (EUCERIN) cream Apply topically as needed for dry skin. 454 g 0  . urea (CARMOL) 10 % cream Apply topically as needed. 71 g 0   No facility-administered medications prior to visit.    Allergies  Allergen Reactions  . Amlodipine Swelling    Lower legs swelling  . Doxycycline Nausea And Vomiting    ROS As per HPI  PE: Vitals with BMI 07/01/2020 06/27/2020 03/08/2020  Height 5\' 11"  5\' 11"  -  Weight 263 lbs 13 oz 263 lbs 260 lbs  BMI 36.81 36.7 34.31  Systolic 149 128  Diastolic 77 88 92  Pulse 87 83 59   R thumb with mild STS most prominent distally, with significant ecchymoses covering medial aspect of dorsum of distal phalanx, entire thumbnail with subungual hematoma. Grossly n/v intact.   LABS:  None today  IMPRESSION AND PLAN:  Thumb contusion. Subungual and periungual hematomas. Plan: evacuate/decompress hematoma, keflex 500mg  tid x 7d, vicodin 5/325 (1-2 q6h prn, #20), bactroban ointment, soak thumb  20 min bid.  Procedure: subungual hematoma evacuation. Consent obtained.  Digital block of R thumb medial and lateral aspect, 94ml of 1% plain lidocaine each side. Used heated tip of 18G needle to penetrate thumbnail in 3 separate areas.  Signif return of mostly milky colored thin fluid, some similar color but thicker contents as well, and some more bloody appearing drainage.  Pt tolerated procedure well.  Also used the 18 G needled to puncture and drain the periungual hematoma/bulla. No immediate complications.  Postprocedure care discussed with pt.  An After Visit Summary was printed and given to the patient.  FOLLOW UP: Return if symptoms worsen or fail to improve.  Signed:  Santiago Bumpers, MD           07/01/2020

## 2020-07-01 NOTE — Telephone Encounter (Signed)
Per Dr. Milinda Antis we have no appts here but thumb needs to be re-eval, I called pt and no answer so left VM letting pt know he needs to f/u with [facility] (they have appts), I also sent mychart message letting pt know he needs to f/u with [facility] because Dr. Milinda Antis wants thumb rechecked  Will also route to PCP so he is aware pt needs to f/u with their office regarding thumb (see pics)

## 2020-07-04 ENCOUNTER — Ambulatory Visit: Payer: BC Managed Care – PPO | Admitting: Internal Medicine

## 2020-07-18 ENCOUNTER — Ambulatory Visit: Payer: BC Managed Care – PPO | Admitting: Family Medicine

## 2020-08-01 ENCOUNTER — Ambulatory Visit: Payer: BC Managed Care – PPO | Admitting: Internal Medicine

## 2020-08-02 ENCOUNTER — Telehealth: Payer: Self-pay | Admitting: Family Medicine

## 2020-08-02 NOTE — Telephone Encounter (Signed)
Reached out to patient for rescheduling of 07/18/20 cancelled appt. Left voicemail to call us at his convenience.

## 2020-08-10 ENCOUNTER — Other Ambulatory Visit: Payer: Self-pay | Admitting: Family Medicine

## 2020-09-13 ENCOUNTER — Other Ambulatory Visit: Payer: Self-pay | Admitting: Family Medicine

## 2020-09-15 ENCOUNTER — Encounter: Payer: Self-pay | Admitting: Family Medicine

## 2020-09-19 DIAGNOSIS — Z79899 Other long term (current) drug therapy: Secondary | ICD-10-CM | POA: Diagnosis not present

## 2020-09-19 DIAGNOSIS — F419 Anxiety disorder, unspecified: Secondary | ICD-10-CM | POA: Diagnosis not present

## 2020-09-19 DIAGNOSIS — F902 Attention-deficit hyperactivity disorder, combined type: Secondary | ICD-10-CM | POA: Diagnosis not present

## 2020-10-04 ENCOUNTER — Telehealth: Payer: Self-pay

## 2020-10-04 MED ORDER — ENALAPRIL MALEATE 20 MG PO TABS: 20.0000 mg | ORAL_TABLET | Freq: Every day | ORAL | 0 refills | Status: DC

## 2020-10-04 NOTE — Telephone Encounter (Signed)
rx sent

## 2020-10-04 NOTE — Telephone Encounter (Signed)
Patient refill request.  Patient scheduled CPE for Monday 10/24/20 with Dr. Milinda Cave. He is off work this day.  Patient stated he will run out of BP meds before appt. Can he be approved for 30 d/s so he will not run out of meds?  enalapril (VASOTEC) 20 MG tablet [[id number]]    [id number] - [place], [place] - [address]

## 2020-10-12 HISTORY — PX: OTHER SURGICAL HISTORY: SHX169

## 2020-10-17 ENCOUNTER — Ambulatory Visit: Payer: BC Managed Care – PPO | Admitting: Family Medicine

## 2020-10-24 ENCOUNTER — Encounter: Payer: Self-pay | Admitting: Family Medicine

## 2020-10-24 ENCOUNTER — Ambulatory Visit (INDEPENDENT_AMBULATORY_CARE_PROVIDER_SITE_OTHER): Payer: BC Managed Care – PPO | Admitting: Family Medicine

## 2020-10-24 ENCOUNTER — Other Ambulatory Visit: Payer: Self-pay

## 2020-10-24 VITALS — BP 126/83 | HR 80 | Temp 97.7°F | Resp 16 | Ht 72.0 in | Wt 263.0 lb

## 2020-10-24 DIAGNOSIS — Z125 Encounter for screening for malignant neoplasm of prostate: Secondary | ICD-10-CM | POA: Diagnosis not present

## 2020-10-24 DIAGNOSIS — Z8279 Family history of other congenital malformations, deformations and chromosomal abnormalities: Secondary | ICD-10-CM

## 2020-10-24 DIAGNOSIS — I10 Essential (primary) hypertension: Secondary | ICD-10-CM

## 2020-10-24 DIAGNOSIS — Z0000 Encounter for general adult medical examination without abnormal findings: Secondary | ICD-10-CM

## 2020-10-24 DIAGNOSIS — Z8249 Family history of ischemic heart disease and other diseases of the circulatory system: Secondary | ICD-10-CM

## 2020-10-24 DIAGNOSIS — R7301 Impaired fasting glucose: Secondary | ICD-10-CM

## 2020-10-24 DIAGNOSIS — Z1211 Encounter for screening for malignant neoplasm of colon: Secondary | ICD-10-CM | POA: Diagnosis not present

## 2020-10-24 LAB — CBC WITH DIFFERENTIAL/PLATELET
Basophils Absolute: 0 10*3/uL (ref 0.0–0.1)
Basophils Relative: 0.8 % (ref 0.0–3.0)
Eosinophils Absolute: 0.1 10*3/uL (ref 0.0–0.7)
Eosinophils Relative: 2 % (ref 0.0–5.0)
HCT: 49.6 % (ref 39.0–52.0)
Hemoglobin: 16.9 g/dL (ref 13.0–17.0)
Lymphocytes Relative: 29.3 % (ref 12.0–46.0)
Lymphs Abs: 1.6 10*3/uL (ref 0.7–4.0)
MCHC: 34.1 g/dL (ref 30.0–36.0)
MCV: 93.5 fl (ref 78.0–100.0)
Monocytes Absolute: 0.4 10*3/uL (ref 0.1–1.0)
Monocytes Relative: 6.9 % (ref 3.0–12.0)
Neutro Abs: 3.4 10*3/uL (ref 1.4–7.7)
Neutrophils Relative %: 61 % (ref 43.0–77.0)
Platelets: 206 10*3/uL (ref 150.0–400.0)
RBC: 5.3 Mil/uL (ref 4.22–5.81)
RDW: 13.2 % (ref 11.5–15.5)
WBC: 5.6 10*3/uL (ref 4.0–10.5)

## 2020-10-24 LAB — LIPID PANEL
Cholesterol: 244 mg/dL — ABNORMAL HIGH (ref 0–200)
HDL: 50.9 mg/dL (ref >39.00)
NonHDL: 193.29
Total CHOL/HDL Ratio: 5
Triglycerides: 245 mg/dL — ABNORMAL HIGH (ref 0.0–149.0)
VLDL: 49 mg/dL — ABNORMAL HIGH (ref 0.0–40.0)

## 2020-10-24 LAB — COMPREHENSIVE METABOLIC PANEL
ALT: 34 U/L (ref 0–53)
AST: 15 U/L (ref 0–37)
Albumin: 4.5 g/dL (ref 3.5–5.2)
Alkaline Phosphatase: 38 U/L — ABNORMAL LOW (ref 39–117)
BUN: 12 mg/dL (ref 6–23)
CO2: 26 mEq/L (ref 19–32)
Calcium: 9.6 mg/dL (ref 8.4–10.5)
Chloride: 102 mEq/L (ref 96–112)
Creatinine, Ser: 0.97 mg/dL (ref 0.40–1.50)
GFR: 88.19 mL/min (ref >60.00)
Glucose, Bld: 93 mg/dL (ref 70–99)
Potassium: 4.2 mEq/L (ref 3.5–5.1)
Sodium: 138 mEq/L (ref 135–145)
Total Bilirubin: 0.6 mg/dL (ref 0.2–1.2)
Total Protein: 6.7 g/dL (ref 6.0–8.3)

## 2020-10-24 LAB — HEMOGLOBIN A1C: Hgb A1c MFr Bld: 5.9 % (ref 4.6–6.5)

## 2020-10-24 LAB — LDL CHOLESTEROL, DIRECT: Direct LDL: 154 mg/dL

## 2020-10-24 LAB — TSH: TSH: 1.48 u[IU]/mL (ref 0.35–4.50)

## 2020-10-24 LAB — PSA: PSA: 0.21 ng/mL (ref 0.10–4.00)

## 2020-10-24 MED ORDER — ENALAPRIL MALEATE 20 MG PO TABS: 20.0000 mg | ORAL_TABLET | Freq: Every day | ORAL | 3 refills | Status: DC

## 2020-10-24 NOTE — Progress Notes (Signed)
Office Note 10/24/2020  CC:  Chief Complaint  Patient presents with   Annual Exam    Pt is fasting    HPI:  Guy Garner is a 55 y.o. White male who is here for annual health maintenance exam and f/u HTN.  Feeling well.  Not exercising regularly, not eating healthy diet for the most part.  HTN: no home bp monitoring.  Compliant with enalapril 20mg .  Unfortunately his older brother (age 22) just recently had chf and bicuspid AV was found-->AVR done. Pt wonders if anything can be done to assess his heart now that we know this FH. He did add that his brother is an alcoholic as well.  He has used alprazolam on prn basis for anxiety and insomnia in the past and continues to do so w/out adverse effect. PMP AWARE reviewed today: most recent rx for alprazolam 0.5mg  was filled 08/22/20, # 15, rx by me. No red flags.  Past Medical History:  Diagnosis Date   Adult ADHD    Managed by Dr. 10/22/20   Anxiety and depression    wellbutrin and citalopram in past   Cellulitis and abscess of leg 03/2015   Right; I&D in ED 03/31/15 (MSSA)   Facial lesion 07/2016   irritated seb keratosis (Dr. Lupton)--cryotherapy performed.   Hyperlipemia, mixed 07/2015; 01/2017; 09/2019   Mild (10 yr framingham risk @ 5%)-no meds indicated. Frm risk 2021 is 6%.   Hypertension 03/2015   IFG (impaired fasting glucose) 07/2015; 01/2017; 09/2019   Low 100-110.  A1c 5.5-5.7%.   Insomnia    ambien in past   OSA (obstructive sleep apnea) 2021   11/02/19 home sleep study: mod-to-sev, needs CPAP    Past Surgical History:  Procedure Laterality Date   HOME SLEEP STUDY  11/02/2019   Dr. 11/04/2019 OSA->cpap recommended   Right LL venous doppler u/s Right 11/21/2018   No DVT   WISDOM TOOTH EXTRACTION      Family History  Problem Relation Age of Onset   Hypothyroidism Mother    Dementia Mother    Heart attack Father    Diabetes Father    Hyperlipidemia Father    Heart disease Father     Hypertension Father    Diabetes Brother    Stroke Brother    Diabetes Maternal Grandmother    Arthritis Paternal Grandmother    Colon cancer Paternal Grandmother        dx mid-70's   Stroke Paternal Grandfather     Social History   Socioeconomic History   Marital status: Single    Spouse name: Not on file   Number of children: Not on file   Years of education: Not on file   Highest education level: Not on file  Occupational History   Not on file  Tobacco Use   Smoking status: Never   Smokeless tobacco: Never  Vaping Use   Vaping Use: Never used  Substance and Sexual Activity   Alcohol use: Yes    Comment: occ   Drug use: No   Sexual activity: Not on file  Other Topics Concern   Not on file  Social History Narrative   Divorced, 1 daughter.   Occupation: Hairdresser/stylist.   No T/A/Ds.   Social Determinants of Health   Financial Resource Strain: Not on file  Food Insecurity: Not on file  Transportation Needs: Not on file  Physical Activity: Not on file  Stress: Not on file  Social Connections: Not on file  Intimate Partner Violence:  Not on file    Outpatient Medications Prior to Visit  Medication Sig Dispense Refill   aspirin EC 81 MG tablet Take 81 mg by mouth daily.     citalopram (CELEXA) 20 MG tablet Take 1 tab by mouth daily.     loratadine (CLARITIN) 10 MG tablet Take 10 mg by mouth daily.     mupirocin ointment (BACTROBAN) 2 % Apply 1 application topically 3 (three) times daily. 15 g 0   MYDAYIS 37.5 MG CP24 Take 1 capsule by mouth daily.     Skin Protectants, Misc. (EUCERIN) cream Apply topically as needed for dry skin. 454 g 0   enalapril (VASOTEC) 20 MG tablet Take 1 tablet (20 mg total) by mouth daily. 30 tablet 0   ALPRAZolam (XANAX) 0.5 MG tablet Take 0.25 mg by mouth daily.      cephALEXin (KEFLEX) 500 MG capsule Take 1 capsule (500 mg total) by mouth 3 (three) times daily. 21 capsule 0   fluticasone (FLONASE) 50 MCG/ACT nasal spray Place into  both nostrils daily.      HYDROcodone-acetaminophen (NORCO/VICODIN) 5-325 MG tablet Take 1-2 tablets by mouth every 4 (four) hours as needed for moderate pain. 20 tablet 0   ibuprofen (ADVIL) 800 MG tablet Take 1 tablet (800 mg total) by mouth every 8 (eight) hours as needed. With food 60 tablet 0   urea (CARMOL) 10 % cream Apply topically as needed. 71 g 0   No facility-administered medications prior to visit.    Allergies  Allergen Reactions   Amlodipine Swelling    Lower legs swelling   Doxycycline Nausea And Vomiting    ROS Review of Systems  Constitutional:  Negative for appetite change, chills, fatigue and fever.  HENT:  Negative for congestion, dental problem, ear pain and sore throat.   Eyes:  Negative for discharge, redness and visual disturbance.  Respiratory:  Negative for cough, chest tightness, shortness of breath and wheezing.   Cardiovascular:  Negative for chest pain, palpitations and leg swelling.  Gastrointestinal:  Negative for abdominal pain, blood in stool, diarrhea, nausea and vomiting.  Genitourinary:  Negative for difficulty urinating, dysuria, flank pain, frequency, hematuria and urgency.  Musculoskeletal:  Negative for arthralgias, back pain, joint swelling, myalgias and neck stiffness.  Skin:  Negative for pallor and rash.  Neurological:  Negative for dizziness, speech difficulty, weakness and headaches.  Hematological:  Negative for adenopathy. Does not bruise/bleed easily.  Psychiatric/Behavioral:  Negative for confusion and sleep disturbance. The patient is not nervous/anxious.    PE; Vitals with BMI 10/24/2020 07/01/2020 06/27/2020  Height 6\' 0"  5\' 11"  5\' 11"   Weight 263 lbs 263 lbs 13 oz 263 lbs  BMI 35.66 36.81 36.7  Systolic 126 149  Diastolic 83 77 88  Pulse 80 87 83    Gen: Alert, well appearing.  Patient is oriented to person, place, time, and situation. AFFECT: pleasant, lucid thought and speech. ENT: Ears: EACs clear, normal  epithelium.  TMs with good light reflex and landmarks bilaterally.  Eyes: no injection, icteris, swelling, or exudate.  EOMI, PERRLA. Nose: no drainage or turbinate edema/swelling.  No injection or focal lesion.  Mouth: lips without lesion/swelling.  Oral mucosa pink and moist.  Dentition intact and without obvious caries or gingival swelling.  Oropharynx without erythema, exudate, or swelling.  Neck: supple/nontender.  No LAD, mass, or TM.  Carotid pulses 2+ bilaterally, without bruits. CV: RRR, no m/r/g.   LUNGS: CTA bilat, nonlabored resps, good aeration in all  lung fields. ABD: soft, NT, ND, BS normal.  No hepatospenomegaly or mass.  No bruits. EXT: no clubbing, cyanosis, or edema. No erythema, skin breakdown, or tenderness. Musculoskeletal: no joint swelling, erythema, warmth, or tenderness.  ROM of all joints intact. Skin - no sores or suspicious lesions or rashes or color changes  Pertinent labs:  Lab Results  Component Value Date   TSH 1.38 09/14/2019   Lab Results  Component Value Date   WBC 7.3 02/22/2020   HGB 17.1 (H) 02/22/2020   HCT 49.8 02/22/2020   MCV 94.0 02/22/2020   PLT 217.0 02/22/2020   Lab Results  Component Value Date   CREATININE 0.99 09/14/2019   BUN 14 09/14/2019   NA 138 09/14/2019   K 4.6 09/14/2019   CL 102 09/14/2019   CO2 29 09/14/2019   Lab Results  Component Value Date   ALT 35 09/14/2019   AST 16 09/14/2019   ALKPHOS 41 09/14/2019   BILITOT 0.6 09/14/2019   Lab Results  Component Value Date   CHOL 230 (H) 09/14/2019   Lab Results  Component Value Date   HDL 48.10 09/14/2019   Lab Results  Component Value Date   LDLCALC 145 (H) 04/21/2018   Lab Results  Component Value Date   TRIG 222.0 (H) 09/14/2019   Lab Results  Component Value Date   CHOLHDL 5 09/14/2019   Lab Results  Component Value Date   PSA 0.17 09/14/2019   PSA 0.19 04/21/2018   PSA 0.19 02/11/2017   Lab Results  Component Value Date   HGBA1C 5.6  09/14/2019    ASSESSMENT AND PLAN:   1) HTN: stable on enalapril 20mg  qd. Lytes/cr today.  2) Intermediate CV risk (10 yr frmhm = about 10%) Pt desires further risk stratification, we discussed coronary calcium score and he wants to get this done so I ordered it today. He already has chosen to take 81mg  ASA qd.  3) Health maintenance exam: Reviewed age and gender appropriate health maintenance issues (prudent diet, regular exercise, health risks of tobacco and excessive alcohol, use of seatbelts, fire alarms in home, use of sunscreen).  Also reviewed age and gender appropriate health screening as well as vaccine recommendations. Vaccines: Tdap booster due->pt declined.  Otherwise UTD. Labs: fasting HP, Hba1c (IFG), PSA. Prostate ca screening: PSA ordered. Colon ca screening: pt has never had initial screening->he's deferring this for now.  An After Visit Summary was printed and given to the patient.  FOLLOW UP:  Return in about 6 months (around 04/25/2021) for routine chronic illness f/u.  Signed:  , MD           10/24/2020

## 2020-10-24 NOTE — Patient Instructions (Signed)
Health Maintenance, Male Adopting a healthy lifestyle and getting preventive care are important in promoting health and wellness. Ask your health care provider about: The right schedule for you to have regular tests and exams. Things you can do on your own to prevent diseases and keep yourself healthy. What should I know about diet, weight, and exercise? Eat a healthy diet  Eat a diet that includes plenty of vegetables, fruits, low-fat dairy products, and lean protein. Do not eat a lot of foods that are high in solid fats, added sugars, or sodium.  Maintain a healthy weight Body mass index (BMI) is a measurement that can be used to identify possible weight problems. It estimates body fat based on height and weight. Your health care provider can help determine your BMI and help you achieve or maintain ahealthy weight. Get regular exercise Get regular exercise. This is one of the most important things you can do for your health. Most adults should: Exercise for at least 150 minutes each week. The exercise should increase your heart rate and make you sweat (moderate-intensity exercise). Do strengthening exercises at least twice a week. This is in addition to the moderate-intensity exercise. Spend less time sitting. Even light physical activity can be beneficial. Watch cholesterol and blood lipids Have your blood tested for lipids and cholesterol at 55 years of age, then havethis test every 5 years. You may need to have your cholesterol levels checked more often if: Your lipid or cholesterol levels are high. You are older than 55 years of age. You are at high risk for heart disease. What should I know about cancer screening? Many types of cancers can be detected early and may often be prevented. Depending on your health history and family history, you may need to have cancer screening at various ages. This may include screening for: Colorectal cancer. Prostate cancer. Skin cancer. Lung  cancer. What should I know about heart disease, diabetes, and high blood pressure? Blood pressure and heart disease High blood pressure causes heart disease and increases the risk of stroke. This is more likely to develop in people who have high blood pressure readings, are of African descent, or are overweight. Talk with your health care provider about your target blood pressure readings. Have your blood pressure checked: Every 3-5 years if you are 18-39 years of age. Every year if you are 40 years old or older. If you are between the ages of 65 and 75 and are a current or former smoker, ask your health care provider if you should have a one-time screening for abdominal aortic aneurysm (AAA). Diabetes Have regular diabetes screenings. This checks your fasting blood sugar level. Have the screening done: Once every three years after age 45 if you are at a normal weight and have a low risk for diabetes. More often and at a younger age if you are overweight or have a high risk for diabetes. What should I know about preventing infection? Hepatitis B If you have a higher risk for hepatitis B, you should be screened for this virus. Talk with your health care provider to find out if you are at risk forhepatitis B infection. Hepatitis C Blood testing is recommended for: Everyone born from 1945 through 1965. Anyone with known risk factors for hepatitis C. Sexually transmitted infections (STIs) You should be screened each year for STIs, including gonorrhea and chlamydia, if: You are sexually active and are younger than 55 years of age. You are older than 55 years of age   and your health care provider tells you that you are at risk for this type of infection. Your sexual activity has changed since you were last screened, and you are at increased risk for chlamydia or gonorrhea. Ask your health care provider if you are at risk. Ask your health care provider about whether you are at high risk for HIV.  Your health care provider may recommend a prescription medicine to help prevent HIV infection. If you choose to take medicine to prevent HIV, you should first get tested for HIV. You should then be tested every 3 months for as long as you are taking the medicine. Follow these instructions at home: Lifestyle Do not use any products that contain nicotine or tobacco, such as cigarettes, e-cigarettes, and chewing tobacco. If you need help quitting, ask your health care provider. Do not use street drugs. Do not share needles. Ask your health care provider for help if you need support or information about quitting drugs. Alcohol use Do not drink alcohol if your health care provider tells you not to drink. If you drink alcohol: Limit how much you have to 0-2 drinks a day. Be aware of how much alcohol is in your drink. In the [place] one drink equals one 12 oz bottle of beer (355 mL), one 5 oz glass of wine (148 mL), or one 1 oz glass of hard liquor (44 mL). General instructions Schedule regular health, dental, and eye exams. Stay current with your vaccines. Tell your health care provider if: You often feel depressed. You have ever been abused or do not feel safe at home. Summary Adopting a healthy lifestyle and getting preventive care are important in promoting health and wellness. Follow your health care provider's instructions about healthy diet, exercising, and getting tested or screened for diseases. Follow your health care provider's instructions on monitoring your cholesterol and blood pressure. This information is not intended to replace advice given to you by your health care provider. Make sure you discuss any questions you have with your healthcare provider. Document Revised: 04/23/2018 Document Reviewed: 04/23/2018 Elsevier Patient Education  2022 [employer]  

## 2020-10-31 ENCOUNTER — Ambulatory Visit
Admission: RE | Admit: 2020-10-31 | Discharge: 2020-10-31 | Disposition: A | Discharge status: Home / Self Care | Payer: BC Managed Care – PPO | Source: Ambulatory Visit | Attending: Family Medicine | Admitting: Family Medicine

## 2020-10-31 ENCOUNTER — Encounter: Payer: Self-pay | Admitting: Family Medicine

## 2020-10-31 ENCOUNTER — Other Ambulatory Visit: Payer: Self-pay

## 2020-10-31 DIAGNOSIS — I10 Essential (primary) hypertension: Secondary | ICD-10-CM

## 2020-10-31 DIAGNOSIS — Z8249 Family history of ischemic heart disease and other diseases of the circulatory system: Secondary | ICD-10-CM

## 2020-10-31 DIAGNOSIS — R7301 Impaired fasting glucose: Secondary | ICD-10-CM | POA: Insufficient documentation

## 2020-11-21 ENCOUNTER — Telehealth: Payer: Self-pay

## 2020-11-21 NOTE — Telephone Encounter (Signed)
Patient called in regards to a bill he received from office visit 10/24/20 with Dr.McGowen for his physical.   Patient can be reached at [phone]

## 2020-11-25 NOTE — Telephone Encounter (Signed)
LVM for pt that CPE was covered by ins 10/24/20. Pt did have labs and a small portion of lab fees were not covered as part of pts deductible. Pt had additional conversation relating to family health concerns and a separate OV charge was processed. Pt has OV copay of $60 per insurance. Advised pt for further detail to contact billing at [phone].

## 2021-01-02 DIAGNOSIS — Z79899 Other long term (current) drug therapy: Secondary | ICD-10-CM | POA: Diagnosis not present

## 2021-01-02 DIAGNOSIS — F902 Attention-deficit hyperactivity disorder, combined type: Secondary | ICD-10-CM | POA: Diagnosis not present

## 2021-02-17 IMAGING — DX DG FINGER THUMB 2+V*R*
3 series · 3 of 3 positions shown · non-contrast
Comparison: None.

CLINICAL DATA: Right thumb pain after injury 3 days ago

EXAM:
RIGHT THUMB 2+V

[finger ap]
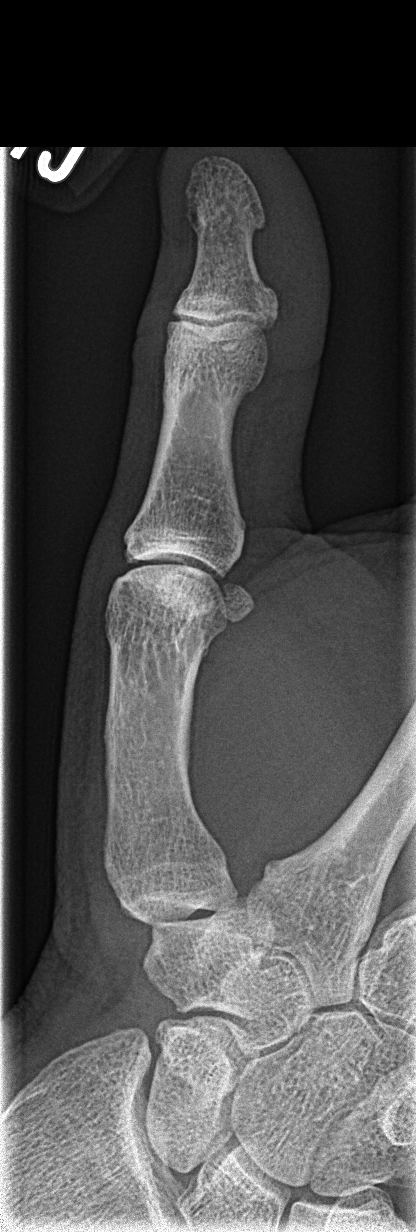

[finger obl]
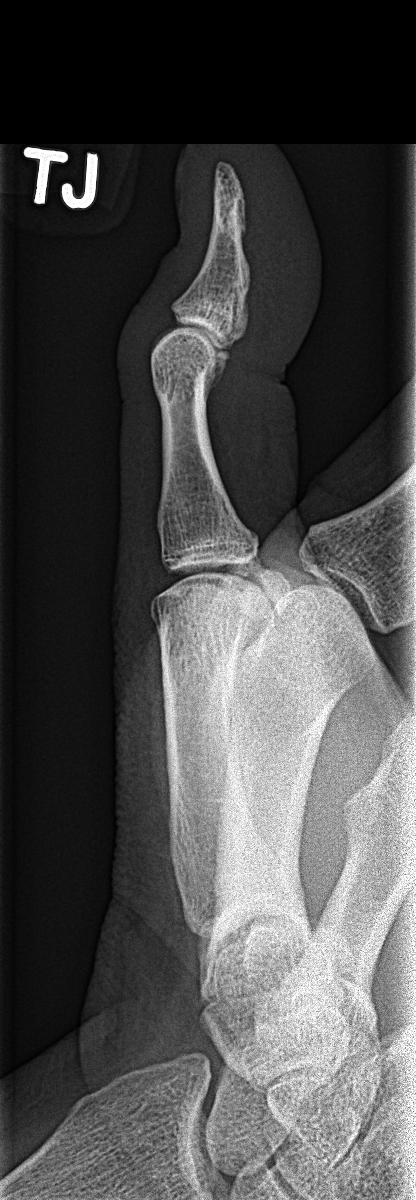

[finger lat]
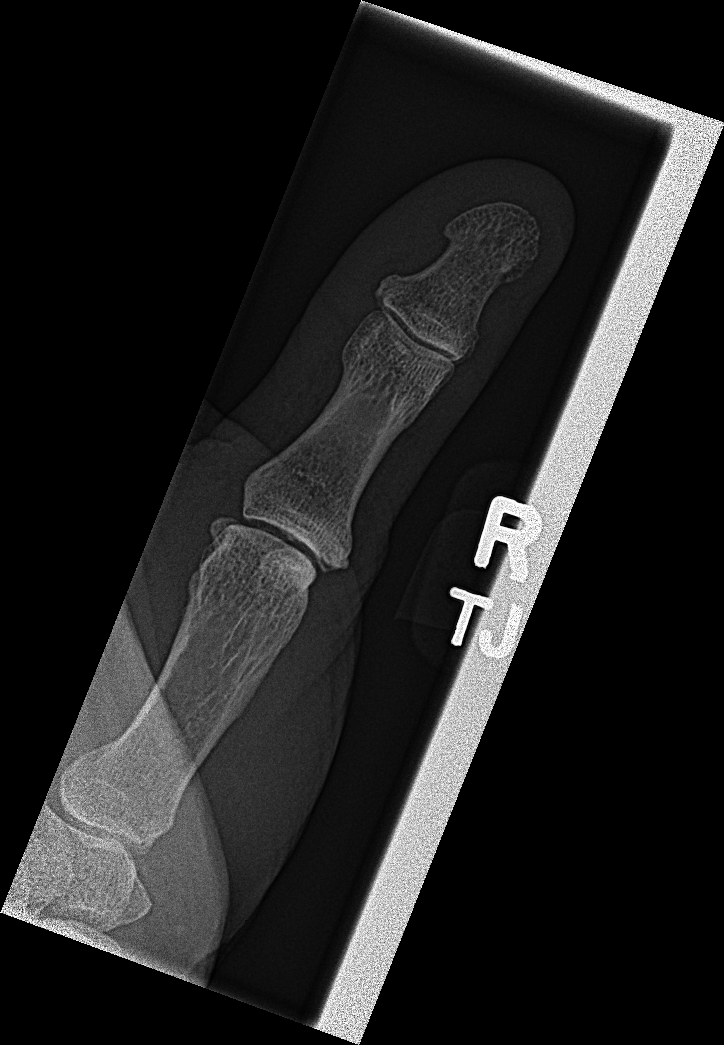

[3 of 3 positions shown; findings below may reference images not displayed]

FINDINGS: There is no evidence of fracture or dislocation. There is no
evidence of arthropathy or other focal bone abnormality. Soft
tissues are unremarkable.
IMPRESSION: Negative.

## 2021-03-13 DIAGNOSIS — F4323 Adjustment disorder with mixed anxiety and depressed mood: Secondary | ICD-10-CM | POA: Diagnosis not present

## 2021-04-11 DIAGNOSIS — F4323 Adjustment disorder with mixed anxiety and depressed mood: Secondary | ICD-10-CM | POA: Diagnosis not present

## 2021-04-24 DIAGNOSIS — F4323 Adjustment disorder with mixed anxiety and depressed mood: Secondary | ICD-10-CM | POA: Diagnosis not present

## 2021-06-12 DIAGNOSIS — F902 Attention-deficit hyperactivity disorder, combined type: Secondary | ICD-10-CM | POA: Diagnosis not present

## 2021-06-12 DIAGNOSIS — Z79899 Other long term (current) drug therapy: Secondary | ICD-10-CM | POA: Diagnosis not present

## 2021-10-02 DIAGNOSIS — F902 Attention-deficit hyperactivity disorder, combined type: Secondary | ICD-10-CM | POA: Diagnosis not present

## 2021-10-02 DIAGNOSIS — Z79899 Other long term (current) drug therapy: Secondary | ICD-10-CM | POA: Diagnosis not present

## 2021-12-11 ENCOUNTER — Other Ambulatory Visit: Payer: Self-pay | Admitting: Family Medicine

## 2021-12-25 DIAGNOSIS — F411 Generalized anxiety disorder: Secondary | ICD-10-CM | POA: Diagnosis not present

## 2021-12-25 DIAGNOSIS — F909 Attention-deficit hyperactivity disorder, unspecified type: Secondary | ICD-10-CM | POA: Diagnosis not present

## 2021-12-25 DIAGNOSIS — F331 Major depressive disorder, recurrent, moderate: Secondary | ICD-10-CM | POA: Diagnosis not present

## 2022-01-09 ENCOUNTER — Other Ambulatory Visit: Payer: Self-pay | Admitting: Family Medicine

## 2022-02-07 ENCOUNTER — Other Ambulatory Visit: Payer: Self-pay | Admitting: Family Medicine

## 2022-02-22 ENCOUNTER — Other Ambulatory Visit: Payer: Self-pay | Admitting: Family Medicine

## 2022-03-12 ENCOUNTER — Ambulatory Visit: Payer: BC Managed Care – PPO | Admitting: Family Medicine

## 2022-03-16 ENCOUNTER — Other Ambulatory Visit: Payer: Self-pay | Admitting: Family Medicine

## 2022-03-17 ENCOUNTER — Other Ambulatory Visit: Payer: Self-pay | Admitting: Family Medicine

## 2022-03-19 ENCOUNTER — Encounter: Payer: BC Managed Care – PPO | Admitting: Family Medicine

## 2022-03-19 ENCOUNTER — Ambulatory Visit: Payer: BC Managed Care – PPO | Admitting: Family Medicine

## 2022-03-27 ENCOUNTER — Other Ambulatory Visit: Payer: Self-pay | Admitting: Family Medicine

## 2022-03-27 MED ORDER — ENALAPRIL MALEATE 20 MG PO TABS: 20.0000 mg | ORAL_TABLET | Freq: Every day | ORAL | 0 refills | Status: DC

## 2022-03-27 NOTE — Telephone Encounter (Signed)
See patient message.  Patient scheduled CPE for 12/18 with Dr. Milinda Cave.  [facility]

## 2022-03-27 NOTE — Telephone Encounter (Signed)
MyChart message read.

## 2022-04-16 DIAGNOSIS — F902 Attention-deficit hyperactivity disorder, combined type: Secondary | ICD-10-CM | POA: Diagnosis not present

## 2022-04-16 DIAGNOSIS — Z79899 Other long term (current) drug therapy: Secondary | ICD-10-CM | POA: Diagnosis not present

## 2022-04-23 ENCOUNTER — Other Ambulatory Visit: Payer: Self-pay | Admitting: Family Medicine

## 2022-04-24 DIAGNOSIS — F902 Attention-deficit hyperactivity disorder, combined type: Secondary | ICD-10-CM | POA: Diagnosis not present

## 2022-04-27 NOTE — Patient Instructions (Signed)
Health Maintenance, Male Adopting a healthy lifestyle and getting preventive care are important in promoting health and wellness. Ask your health care provider about: The right schedule for you to have regular tests and exams. Things you can do on your own to prevent diseases and keep yourself healthy. What should I know about diet, weight, and exercise? Eat a healthy diet  Eat a diet that includes plenty of vegetables, fruits, low-fat dairy products, and lean protein. Do not eat a lot of foods that are high in solid fats, added sugars, or sodium. Maintain a healthy weight Body mass index (BMI) is a measurement that can be used to identify possible weight problems. It estimates body fat based on height and weight. Your health care provider can help determine your BMI and help you achieve or maintain a healthy weight. Get regular exercise Get regular exercise. This is one of the most important things you can do for your health. Most adults should: Exercise for at least 150 minutes each week. The exercise should increase your heart rate and make you sweat (moderate-intensity exercise). Do strengthening exercises at least twice a week. This is in addition to the moderate-intensity exercise. Spend less time sitting. Even light physical activity can be beneficial. Watch cholesterol and blood lipids Have your blood tested for lipids and cholesterol at 56 years of age, then have this test every 5 years. You may need to have your cholesterol levels checked more often if: Your lipid or cholesterol levels are high. You are older than 56 years of age. You are at high risk for heart disease. What should I know about cancer screening? Many types of cancers can be detected early and may often be prevented. Depending on your health history and family history, you may need to have cancer screening at various ages. This may include screening for: Colorectal cancer. Prostate cancer. Skin cancer. Lung  cancer. What should I know about heart disease, diabetes, and high blood pressure? Blood pressure and heart disease High blood pressure causes heart disease and increases the risk of stroke. This is more likely to develop in people who have high blood pressure readings or are overweight. Talk with your health care provider about your target blood pressure readings. Have your blood pressure checked: Every 3-5 years if you are 18-39 years of age. Every year if you are 40 years old or older. If you are between the ages of 65 and 75 and are a current or former smoker, ask your health care provider if you should have a one-time screening for abdominal aortic aneurysm (AAA). Diabetes Have regular diabetes screenings. This checks your fasting blood sugar level. Have the screening done: Once every three years after age 45 if you are at a normal weight and have a low risk for diabetes. More often and at a younger age if you are overweight or have a high risk for diabetes. What should I know about preventing infection? Hepatitis B If you have a higher risk for hepatitis B, you should be screened for this virus. Talk with your health care provider to find out if you are at risk for hepatitis B infection. Hepatitis C Blood testing is recommended for: Everyone born from 1945 through 1965. Anyone with known risk factors for hepatitis C. Sexually transmitted infections (STIs) You should be screened each year for STIs, including gonorrhea and chlamydia, if: You are sexually active and are younger than 56 years of age. You are older than 56 years of age and your   health care provider tells you that you are at risk for this type of infection. Your sexual activity has changed since you were last screened, and you are at increased risk for chlamydia or gonorrhea. Ask your health care provider if you are at risk. Ask your health care provider about whether you are at high risk for HIV. Your health care provider  may recommend a prescription medicine to help prevent HIV infection. If you choose to take medicine to prevent HIV, you should first get tested for HIV. You should then be tested every 3 months for as long as you are taking the medicine. Follow these instructions at home: Alcohol use Do not drink alcohol if your health care provider tells you not to drink. If you drink alcohol: Limit how much you have to 0-2 drinks a day. Know how much alcohol is in your drink. In the [place] one drink equals one 12 oz bottle of beer (355 mL), one 5 oz glass of wine (148 mL), or one 1 oz glass of hard liquor (44 mL). Lifestyle Do not use any products that contain nicotine or tobacco. These products include cigarettes, chewing tobacco, and vaping devices, such as e-cigarettes. If you need help quitting, ask your health care provider. Do not use street drugs. Do not share needles. Ask your health care provider for help if you need support or information about quitting drugs. General instructions Schedule regular health, dental, and eye exams. Stay current with your vaccines. Tell your health care provider if: You often feel depressed. You have ever been abused or do not feel safe at home. Summary Adopting a healthy lifestyle and getting preventive care are important in promoting health and wellness. Follow your health care provider's instructions about healthy diet, exercising, and getting tested or screened for diseases. Follow your health care provider's instructions on monitoring your cholesterol and blood pressure. This information is not intended to replace advice given to you by your health care provider. Make sure you discuss any questions you have with your health care provider. Document Revised: 09/19/2020 Document Reviewed: 09/19/2020 Elsevier Patient Education  2023 Elsevier Inc.  

## 2022-04-30 ENCOUNTER — Encounter: Payer: Self-pay | Admitting: Family Medicine

## 2022-04-30 ENCOUNTER — Ambulatory Visit (INDEPENDENT_AMBULATORY_CARE_PROVIDER_SITE_OTHER): Payer: BC Managed Care – PPO | Admitting: Family Medicine

## 2022-04-30 VITALS — BP 150/93 | HR 65 | Temp 98.3°F | Ht 73.0 in | Wt 271.4 lb

## 2022-04-30 DIAGNOSIS — R6882 Decreased libido: Secondary | ICD-10-CM | POA: Diagnosis not present

## 2022-04-30 DIAGNOSIS — E782 Mixed hyperlipidemia: Secondary | ICD-10-CM

## 2022-04-30 DIAGNOSIS — Z125 Encounter for screening for malignant neoplasm of prostate: Secondary | ICD-10-CM

## 2022-04-30 DIAGNOSIS — Z23 Encounter for immunization: Secondary | ICD-10-CM

## 2022-04-30 DIAGNOSIS — F411 Generalized anxiety disorder: Secondary | ICD-10-CM

## 2022-04-30 DIAGNOSIS — Z0000 Encounter for general adult medical examination without abnormal findings: Secondary | ICD-10-CM | POA: Diagnosis not present

## 2022-04-30 DIAGNOSIS — R7301 Impaired fasting glucose: Secondary | ICD-10-CM | POA: Diagnosis not present

## 2022-04-30 DIAGNOSIS — F331 Major depressive disorder, recurrent, moderate: Secondary | ICD-10-CM | POA: Diagnosis not present

## 2022-04-30 LAB — LIPID PANEL
Cholesterol: 261 mg/dL — ABNORMAL HIGH (ref 0–200)
HDL: 58.1 mg/dL (ref >39.00)
LDL Cholesterol: 170 mg/dL — ABNORMAL HIGH (ref 0–99)
NonHDL: 203.34
Total CHOL/HDL Ratio: 4
Triglycerides: 165 mg/dL — ABNORMAL HIGH (ref 0.0–149.0)
VLDL: 33 mg/dL (ref 0.0–40.0)

## 2022-04-30 LAB — COMPREHENSIVE METABOLIC PANEL
ALT: 34 U/L (ref 0–53)
AST: 15 U/L (ref 0–37)
Albumin: 4.8 g/dL (ref 3.5–5.2)
Alkaline Phosphatase: 40 U/L (ref 39–117)
BUN: 11 mg/dL (ref 6–23)
CO2: 29 mEq/L (ref 19–32)
Calcium: 10.5 mg/dL (ref 8.4–10.5)
Chloride: 101 mEq/L (ref 96–112)
Creatinine, Ser: 1.09 mg/dL (ref 0.40–1.50)
GFR: 75.86 mL/min (ref >60.00)
Glucose, Bld: 105 mg/dL — ABNORMAL HIGH (ref 70–99)
Potassium: 4.7 mEq/L (ref 3.5–5.1)
Sodium: 139 mEq/L (ref 135–145)
Total Bilirubin: 0.5 mg/dL (ref 0.2–1.2)
Total Protein: 7.1 g/dL (ref 6.0–8.3)

## 2022-04-30 LAB — CBC
HCT: 49.3 % (ref 39.0–52.0)
Hemoglobin: 17.2 g/dL — ABNORMAL HIGH (ref 13.0–17.0)
MCHC: 34.8 g/dL (ref 30.0–36.0)
MCV: 92.4 fl (ref 78.0–100.0)
Platelets: 194 10*3/uL (ref 150.0–400.0)
RBC: 5.33 Mil/uL (ref 4.22–5.81)
RDW: 13.1 % (ref 11.5–15.5)
WBC: 4.5 10*3/uL (ref 4.0–10.5)

## 2022-04-30 LAB — TSH: TSH: 1.31 u[IU]/mL (ref 0.35–5.50)

## 2022-04-30 LAB — PSA: PSA: 0.26 ng/mL (ref 0.10–4.00)

## 2022-04-30 LAB — HEMOGLOBIN A1C: Hgb A1c MFr Bld: 5.9 % (ref 4.6–6.5)

## 2022-04-30 LAB — TESTOSTERONE: Testosterone: 278.54 ng/dL — ABNORMAL LOW (ref 300.00–890.00)

## 2022-04-30 MED ORDER — ENALAPRIL MALEATE 20 MG PO TABS: ORAL_TABLET | ORAL | 3 refills | Status: DC

## 2022-04-30 NOTE — Progress Notes (Signed)
Office Note 04/30/2022  CC:  Chief Complaint  Patient presents with   Annual Exam    Physical exam,  no other concerns     HPI:  Patient is a 56 y.o. male who is here for annual health maintenance exam and follow-up hypertension. Home blood pressures: Sounds like average is around 135-140/80-85. Some systolics as high as the [id number]. He has been compliant with enalapril 20 mg a day.  Has not had good dietary or exercise habits in the last 18 months or so. Dating back to beginning of [place] pandemic he notes gradual increase in anxiety level, subsequent poor motivation, decreased concentration, depressed mood, anhedonia. Describes not wanting to get out of the house for fear of getting very anxious around people. Has had several significant life changes the last year or 2, requiring lots of adjustment. He does currently get counseling. No suicidal ideation or homicidal ideation.  However, he does think occasionally about the fact that he would be better off dead.   Past Medical History:  Diagnosis Date   Adult ADHD    Managed by Dr. Johnnye Sima   Anxiety and depression    wellbutrin and citalopram in past   Cellulitis and abscess of leg 03/2015   Right; I&D in ED 03/31/15 ([facility]SA)   Facial lesion 07/2016   irritated seb keratosis (Dr. Lupton)--cryotherapy performed.   Family history of bicuspid aortic valve    older brother had to get AV replacement   Hyperlipemia, mixed 07/2015; 01/2017; 09/2019   Mild (10 yr framingham risk @ 5%)-no meds indicated. Frm risk 2021 is 6%.   Hypertension 03/2015   IFG (impaired fasting glucose) 07/2015; 01/2017; 09/2019   Low 100-110.  A1c 5.5-5.7%. Upt to 5.9% June 2022.   Insomnia    ambien in past   OSA (obstructive sleep apnea) 2021   11/02/19 home sleep study: mod-to-sev, needs CPAP    Past Surgical History:  Procedure Laterality Date   coronary calcium score  10/2020   57th %'tile-->no statin recommended.   HOME SLEEP STUDY  11/02/2019    Dr. Emeline Gins OSA->cpap recommended   Right LL venous doppler u/s Right 11/21/2018   No DVT   WISDOM TOOTH EXTRACTION      Family History  Problem Relation Age of Onset   Hypothyroidism Mother    Dementia Mother    Heart attack Father    Diabetes Father    Hyperlipidemia Father    Heart disease Father    Hypertension Father    Diabetes Brother    Stroke Brother    Diabetes Maternal Grandmother    Arthritis Paternal Grandmother    Colon cancer Paternal Grandmother        dx mid-70's   Stroke Paternal Grandfather     Social History   Socioeconomic History   Marital status: Single    Spouse name: Not on file   Number of children: Not on file   Years of education: Not on file   Highest education level: Associate degree: occupational, Hotel manager, or vocational program  Occupational History   Not on file  Tobacco Use   Smoking status: Never   Smokeless tobacco: Never  Vaping Use   Vaping Use: Never used  Substance and Sexual Activity   Alcohol use: Yes    Comment: occ   Drug use: No   Sexual activity: Not on file  Other Topics Concern   Not on file  Social History Narrative   Divorced, 1 daughter.   Occupation: Hairdresser/stylist.  No T/A/Ds.   Social Determinants of Health   Financial Resource Strain: Low Risk  (03/15/2022)   Overall Financial Resource Strain (CARDIA)    Difficulty of Paying Living Expenses: Not very hard  Food Insecurity: No Food Insecurity (03/15/2022)   Hunger Vital Sign    Worried About Running Out of Food in the Last Year: Never true    Ran Out of Food in the Last Year: Never true  Transportation Needs: No Transportation Needs (03/15/2022)   PRAPARE - Hydrologist (Medical): No    Lack of Transportation (Non-Medical): No  Physical Activity: Insufficiently Active (03/15/2022)   Exercise Vital Sign    Days of Exercise per Week: 3 days    Minutes of Exercise per Session: 20 min  Stress: Stress  Concern Present (03/15/2022)   [place]    Feeling of Stress : To some extent  Social Connections: Socially Isolated (03/15/2022)   Social Connection and Isolation Panel [NHANES]    Frequency of Communication with Friends and Family: Never    Frequency of Social Gatherings with Friends and Family: Never    Attends Religious Services: Never    Marine scientist or Organizations: No    Attends Music therapist: Not on file    Marital Status: Divorced  Human resources officer Violence: Not on file    Outpatient Medications Prior to Visit  Medication Sig Dispense Refill   aspirin EC 81 MG tablet Take 81 mg by mouth daily.     citalopram (CELEXA) 20 MG tablet Take 1 tab by mouth daily.     hydrOXYzine (ATARAX) 25 MG tablet Take 25 mg by mouth daily.     MYDAYIS 37.5 MG CP24 Take 1 capsule by mouth daily.     enalapril (VASOTEC) 20 MG tablet TAKE 1 TABLET BY MOUTH EVERY DAY MUST KEEP APPT 7 tablet 0   Skin Protectants, Misc. (EUCERIN) cream Apply topically as needed for dry skin. (Patient not taking: Reported on 04/30/2022) 454 g 0   loratadine (CLARITIN) 10 MG tablet Take 10 mg by mouth daily.     mupirocin ointment (BACTROBAN) 2 % Apply 1 application topically 3 (three) times daily. 15 g 0   No facility-administered medications prior to visit.    Allergies  Allergen Reactions   Amlodipine Swelling    Lower legs swelling   Doxycycline Nausea And Vomiting    ROS Review of Systems  Constitutional:  Negative for appetite change, chills, fatigue and fever.  HENT:  Negative for congestion, dental problem, ear pain and sore throat.   Eyes:  Negative for discharge, redness and visual disturbance.  Respiratory:  Negative for cough, chest tightness, shortness of breath and wheezing.   Cardiovascular:  Negative for chest pain, palpitations and leg swelling.  Gastrointestinal:  Negative for abdominal pain, blood in  stool, diarrhea, nausea and vomiting.  Genitourinary:  Negative for difficulty urinating, dysuria, flank pain, frequency, hematuria and urgency.  Musculoskeletal:  Negative for arthralgias, back pain, joint swelling, myalgias and neck stiffness.  Skin:  Negative for pallor and rash.  Neurological:  Negative for dizziness, speech difficulty, weakness and headaches.  Hematological:  Negative for adenopathy. Does not bruise/bleed easily.  Psychiatric/Behavioral:  Negative for confusion and sleep disturbance. The patient is not nervous/anxious.     PE;    04/30/2022    8:53 AM 10/24/2020    8:30 AM 07/01/2020    2:25 PM  Vitals  with BMI  Height 6\' 1"  6\' 0"  5\' 11"   Weight 271 lbs 6 oz 263 lbs 263 lbs 13 oz  BMI 35.81 35.66 36.81  Systolic 150 126  Diastolic 93 83 77  Pulse 65 80 87   Gen: Alert, well appearing.  Patient is oriented to person, place, time, and situation. AFFECT: pleasant, lucid thought and speech. ENT: Ears: EACs clear, normal epithelium.  TMs with good light reflex and landmarks bilaterally.  Eyes: no injection, icteris, swelling, or exudate.  EOMI, PERRLA. Nose: no drainage or turbinate edema/swelling.  No injection or focal lesion.  Mouth: lips without lesion/swelling.  Oral mucosa pink and moist.  Dentition intact and without obvious caries or gingival swelling.  Oropharynx without erythema, exudate, or swelling.  Neck: supple/nontender.  No LAD, mass, or TM.  Carotid pulses 2+ bilaterally, without bruits. CV: RRR, no m/r/g.   LUNGS: CTA bilat, nonlabored resps, good aeration in all lung fields. ABD: soft, NT, ND, BS normal.  No hepatospenomegaly or mass.  No bruits. EXT: no clubbing, cyanosis, or edema.  Musculoskeletal: no joint swelling, erythema, warmth, or tenderness.  ROM of all joints intact. Skin - no sores or suspicious lesions or rashes or color changes  Pertinent labs:  Lab Results  Component Value Date   TSH 1.48 10/24/2020   Lab Results   Component Value Date   WBC 5.6 10/24/2020   HGB 16.9 10/24/2020   HCT 49.6 10/24/2020   MCV 93.5 10/24/2020   PLT 206.0 10/24/2020   Lab Results  Component Value Date   CREATININE 0.97 10/24/2020   BUN 12 10/24/2020   NA 138 10/24/2020   K 4.2 10/24/2020   CL 102 10/24/2020   CO2 26 10/24/2020   Lab Results  Component Value Date   ALT 34 10/24/2020   AST 15 10/24/2020   ALKPHOS 38 (L) 10/24/2020   BILITOT 0.6 10/24/2020   Lab Results  Component Value Date   CHOL 244 (H) 10/24/2020   Lab Results  Component Value Date   HDL 50.90 10/24/2020   Lab Results  Component Value Date   LDLCALC 145 (H) 04/21/2018   Lab Results  Component Value Date   TRIG 245.0 (H) 10/24/2020   Lab Results  Component Value Date   CHOLHDL 5 10/24/2020   Lab Results  Component Value Date   PSA 0.21 10/24/2020   PSA 0.17 09/14/2019   PSA 0.19 04/21/2018   Lab Results  Component Value Date   HGBA1C 5.9 10/24/2020    ASSESSMENT AND PLAN:   #1 health maintenance exam: Reviewed age and gender appropriate health maintenance issues (prudent diet, regular exercise, health risks of tobacco and excessive alcohol, use of seatbelts, fire alarms in home, use of sunscreen).  Also reviewed age and gender appropriate health screening as well as vaccine recommendations. Vaccines: Tdap->declined.  Shingrix->declined.  Flu->declined. Labs: Fasting HP, Hba1c (IFG), PSA Prostate ca screening:  PSA today Colon ca screening: due for initial screening.  Options discussed-->he'll defer for now.  #2 major depressive disorder, recurrent. GAD, having some near-panic attacks as well. Discussed change over to Cymbalta today but he wants to research this first. He will continue citalopram 20 mg a day for now.  #3 essential hypertension, uncontrolled. Increase enalapril to 20 mg twice daily. Electrolytes and creatinine today.  #4 decreased libido. Check testosterone level today.  #5  prediabetes/impaired fasting glucose. Fasting glucose and hemoglobin A1c today. He is aware of the dietary and exercise changes he needs to  do.  #6 mixed hyperlipidemia Intermediate Framingham risk.   Coronary calcium score--low risk 2022.   No meds in the past. Lipid panel today.  An After Visit Summary was printed and given to the patient.  FOLLOW UP:  Return for 2-3 wks f/u htn/mood/anx.  Signed:  Crissie Sickles, MD           04/30/2022

## 2022-05-01 ENCOUNTER — Other Ambulatory Visit (INDEPENDENT_AMBULATORY_CARE_PROVIDER_SITE_OTHER): Payer: BC Managed Care – PPO

## 2022-05-01 ENCOUNTER — Encounter: Payer: Self-pay | Admitting: Family Medicine

## 2022-05-01 DIAGNOSIS — R6882 Decreased libido: Secondary | ICD-10-CM | POA: Diagnosis not present

## 2022-05-01 DIAGNOSIS — R7989 Other specified abnormal findings of blood chemistry: Secondary | ICD-10-CM | POA: Diagnosis not present

## 2022-05-01 LAB — IBC + FERRITIN
Ferritin: 72.2 ng/mL (ref 22.0–322.0)
Iron: 132 ug/dL (ref 42–165)
Saturation Ratios: 37.7 % (ref 20.0–50.0)
TIBC: 350 ug/dL (ref 250.0–450.0)
Transferrin: 250 mg/dL (ref 212.0–360.0)

## 2022-05-01 LAB — LUTEINIZING HORMONE: LH: 6.67 m[IU]/mL (ref 1.50–9.30)

## 2022-05-01 MED ORDER — CITALOPRAM HYDROBROMIDE 10 MG PO TABS: ORAL_TABLET | ORAL | 0 refills | Status: DC

## 2022-05-01 MED ORDER — DULOXETINE HCL 20 MG PO CPEP: ORAL_CAPSULE | ORAL | 0 refills | Status: DC

## 2022-05-01 NOTE — Telephone Encounter (Signed)
OK Shana, sounds good. I sent in rx for lower dose of citalopram and low-dose duloxetine with clear instructions on the bottles.  Go ahead and stop your current dose of citalopram now. Best,  Michele Mcalpine

## 2022-05-02 LAB — PROLACTIN: Prolactin: 6.7 ng/mL (ref 2.0–18.0)

## 2022-05-07 ENCOUNTER — Encounter: Payer: Self-pay | Admitting: Family Medicine

## 2022-05-08 ENCOUNTER — Encounter: Payer: Self-pay | Admitting: Family Medicine

## 2022-05-21 ENCOUNTER — Ambulatory Visit: Payer: BC Managed Care – PPO | Admitting: Family Medicine

## 2022-05-21 ENCOUNTER — Encounter: Payer: Self-pay | Admitting: Family Medicine

## 2022-05-21 NOTE — Progress Notes (Deleted)
OFFICE VISIT  05/21/2022  CC: No chief complaint on file.   Patient is a 57 y.o. male who presents for *** A/P as of last visit: "major depressive disorder, recurrent. GAD, having some near-panic attacks as well. Discussed change over to Cymbalta today but he wants to research this first. He will continue citalopram 20 mg a day for now.   #3 essential hypertension, uncontrolled. Increase enalapril to 20 mg twice daily. Electrolytes and creatinine today.   #4 decreased libido. Check testosterone level today.   #5 prediabetes/impaired fasting glucose. Fasting glucose and hemoglobin A1c today. He is aware of the dietary and exercise changes he needs to do.   #6 mixed hyperlipidemia Intermediate Framingham risk.   Coronary calcium score--low risk 2022.   No meds in the past. Lipid panel today."  INTERIM HX: ***  Past Medical History:  Diagnosis Date   Adult ADHD    Managed by Dr. Johnnye Sima   Anxiety and depression    wellbutrin and citalopram in past   Cellulitis and abscess of leg 03/2015   Right; I&D in ED 03/31/15 ([facility]SA)   Family history of bicuspid aortic valve    older brother had to get AV replacement   Hyperlipemia, mixed 07/2015; 01/2017; 09/2019   Mild (10 yr framingham risk @ 5%)-no meds indicated. Frm risk 2021 is 6%.   Hypertension 03/2015   IFG (impaired fasting glucose) 07/2015; 01/2017; 09/2019   Low 100-110.  A1c 5.5-5.7%. Upt to 5.9% June 2022.   Insomnia    ambien in past   OSA (obstructive sleep apnea) 2021   11/02/19 home sleep study: mod-to-sev, needs CPAP    Past Surgical History:  Procedure Laterality Date   coronary calcium score  10/2020   57th %'tile-->no statin recommended.   HOME SLEEP STUDY  11/02/2019   Dr. Emeline Gins OSA->cpap recommended   Right LL venous doppler u/s Right 11/21/2018   No DVT   WISDOM TOOTH EXTRACTION      Outpatient Medications Prior to Visit  Medication Sig Dispense Refill   aspirin EC 81 MG tablet  Take 81 mg by mouth daily.     citalopram (CELEXA) 10 MG tablet 1 tab po qd x 7d then stop 7 tablet 0   DULoxetine (CYMBALTA) 20 MG capsule 1 cap po qd x 7d then increase to 2 caps po qd 53 capsule 0   enalapril (VASOTEC) 20 MG tablet 1 tab po bid 180 tablet 3   hydrOXYzine (ATARAX) 25 MG tablet Take 25 mg by mouth daily.     MYDAYIS 37.5 MG CP24 Take 1 capsule by mouth daily.     Skin Protectants, Misc. (EUCERIN) cream Apply topically as needed for dry skin. (Patient not taking: Reported on 04/30/2022) 454 g 0   No facility-administered medications prior to visit.    Allergies  Allergen Reactions   Amlodipine Swelling    Lower legs swelling   Doxycycline Nausea And Vomiting    Review of Systems As per HPI  PE:    04/30/2022    8:53 AM 10/24/2020    8:30 AM 07/01/2020    2:25 PM  Vitals with BMI  Height 6\' 1"  6\' 0"  5\' 11"   Weight 271 lbs 6 oz 263 lbs 263 lbs 13 oz  BMI 35.81 15.40 08.67  Systolic 619 509 326  Diastolic 93 83 77  Pulse 65 80 87     Physical Exam  ***  LABS:  Last CBC Lab Results  Component Value Date  WBC 4.5 04/30/2022   HGB 17.2 (H) 04/30/2022   HCT 49.3 04/30/2022   MCV 92.4 04/30/2022   RDW 13.1 04/30/2022   PLT 194.0 04/30/2022   Last metabolic panel Lab Results  Component Value Date   GLUCOSE 105 (H) 04/30/2022   NA 139 04/30/2022   K 4.7 04/30/2022   CL 101 04/30/2022   CO2 29 04/30/2022   BUN 11 04/30/2022   CREATININE 1.09 04/30/2022   CALCIUM 10.5 04/30/2022   PROT 7.1 04/30/2022   ALBUMIN 4.8 04/30/2022   BILITOT 0.5 04/30/2022   ALKPHOS 40 04/30/2022   AST 15 04/30/2022   ALT 34 04/30/2022   Last lipids Lab Results  Component Value Date   CHOL 261 (H) 04/30/2022   HDL 58.10 04/30/2022   LDLCALC 170 (H) 04/30/2022   LDLDIRECT 154.0 10/24/2020   TRIG 165.0 (H) 04/30/2022   CHOLHDL 4 04/30/2022   Last hemoglobin A1c Lab Results  Component Value Date   HGBA1C 5.9 04/30/2022   Last thyroid functions Lab  Results  Component Value Date   TSH 1.31 04/30/2022   Lab Results  Component Value Date   TESTOSTERONE 278.54 (L) 04/30/2022   IMPRESSION AND PLAN:  No problem-specific Assessment & Plan notes found for this encounter.   An After Visit Summary was printed and given to the patient.  FOLLOW UP: No follow-ups on file.  Signed:  Santiago Bumpers, MD           05/21/2022

## 2022-05-24 ENCOUNTER — Other Ambulatory Visit: Payer: Self-pay | Admitting: Family Medicine

## 2022-05-28 ENCOUNTER — Ambulatory Visit: Payer: BC Managed Care – PPO | Admitting: Family Medicine

## 2022-05-28 ENCOUNTER — Encounter: Payer: Self-pay | Admitting: Family Medicine

## 2022-05-28 VITALS — BP 127/84 | HR 84 | Temp 97.7°F | Ht 73.0 in | Wt 271.4 lb

## 2022-05-28 DIAGNOSIS — R519 Headache, unspecified: Secondary | ICD-10-CM | POA: Diagnosis not present

## 2022-05-28 DIAGNOSIS — R7989 Other specified abnormal findings of blood chemistry: Secondary | ICD-10-CM

## 2022-05-28 DIAGNOSIS — F331 Major depressive disorder, recurrent, moderate: Secondary | ICD-10-CM

## 2022-05-28 DIAGNOSIS — R051 Acute cough: Secondary | ICD-10-CM

## 2022-05-28 DIAGNOSIS — F411 Generalized anxiety disorder: Secondary | ICD-10-CM | POA: Diagnosis not present

## 2022-05-28 DIAGNOSIS — I10 Essential (primary) hypertension: Secondary | ICD-10-CM | POA: Diagnosis not present

## 2022-05-28 LAB — TESTOSTERONE: Testosterone: 223.93 ng/dL — ABNORMAL LOW (ref 300.00–890.00)

## 2022-05-28 LAB — POC COVID19 BINAXNOW: SARS Coronavirus 2 Ag: NEGATIVE

## 2022-05-28 MED ORDER — DULOXETINE HCL 20 MG PO CPEP: ORAL_CAPSULE | ORAL | 5 refills | Status: DC

## 2022-05-28 NOTE — Progress Notes (Signed)
OFFICE VISIT  05/28/2022  CC:  Chief Complaint  Patient presents with   Cough    Pt also c/o congestion, minor body aches/chills, headache. Denies n/v/d and unsure if he has a fever. Completed at home covid test 10 days ago, it was negative.     Patient is a 57 y.o. male who presents for 1 month follow-up hypertension, depression, and anxiety. A/P as of last visit: "#1 health maintenance exam: Reviewed age and gender appropriate health maintenance issues (prudent diet, regular exercise, health risks of tobacco and excessive alcohol, use of seatbelts, fire alarms in home, use of sunscreen).  Also reviewed age and gender appropriate health screening as well as vaccine recommendations. Vaccines: Tdap->declined.  Shingrix->declined.  Flu->declined. Labs: Fasting HP, Hba1c (IFG), PSA Prostate ca screening:  PSA today Colon ca screening: due for initial screening.  Options discussed-->he'll defer for now.   #2 major depressive disorder, recurrent. GAD, having some near-panic attacks as well. Discussed change over to Cymbalta today but he wants to research this first. He will continue citalopram 20 mg a day for now.   #3 essential hypertension, uncontrolled. Increase enalapril to 20 mg twice daily. Electrolytes and creatinine today.   #4 decreased libido. Check testosterone level today.   #5 prediabetes/impaired fasting glucose. Fasting glucose and hemoglobin A1c today. He is aware of the dietary and exercise changes he needs to do.   #6 mixed hyperlipidemia Intermediate Framingham risk.   Coronary calcium score--low risk 2022.   No meds in the past. Lipid panel today."  INTERIM HX: URI started yest: ST, nasal/sinus pressure, cough.  No fever, no n/v. +fatigue/achy.   Cymbalta: doing ok with transition but causing some sleep probs, has to take hydroxyzine 25 + 1/2 xanax lately.  Enalapril: home bp good/normal  Testosterone labs: dec libido and energy, wants to try  injections   Past Medical History:  Diagnosis Date   Adult ADHD    Managed by Dr. Elisabeth Most   Anxiety and depression    wellbutrin and citalopram in past   Cellulitis and abscess of leg 03/2015   Right; I&D in ED 03/31/15 (MSSA)   Family history of bicuspid aortic valve    older brother had to get AV replacement   Hyperlipemia, mixed 07/2015; 01/2017; 09/2019   Mild (10 yr framingham risk @ 5%)-no meds indicated. Frm risk 2021 is 6%.   Hypertension 03/2015   IFG (impaired fasting glucose) 07/2015; 01/2017; 09/2019   Low 100-110.  A1c 5.5-5.7%. 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   HOME SLEEP STUDY  11/02/2019   Dr. Kathlen Brunswick OSA->cpap recommended   Right LL venous doppler u/s Right 11/21/2018   No DVT   WISDOM TOOTH EXTRACTION      Outpatient Medications Prior to Visit  Medication Sig Dispense Refill   ALPRAZolam (XANAX) 0.5 MG tablet Take 0.5 mg by mouth daily.     aspirin EC 81 MG tablet Take 81 mg by mouth daily.     enalapril (VASOTEC) 20 MG tablet 1 tab po bid 180 tablet 3   hydrOXYzine (ATARAX) 25 MG tablet Take 25 mg by mouth daily.     MYDAYIS 37.5 MG  CP24 Take 1 capsule by mouth daily.     Skin Protectants, Misc. (EUCERIN) cream Apply topically as needed for dry skin. 454 g 0   DULoxetine (CYMBALTA) 20 MG capsule 1 cap po qd x 7d then increase to 2 caps po qd 53 capsule 0   citalopram (CELEXA) 10 MG tablet 1 tab po qd x 7d then stop 7 tablet 0   No facility-administered medications prior to visit.    Allergies  Allergen Reactions   Amlodipine Swelling    Lower legs swelling   Doxycycline Nausea And Vomiting    Review of Systems As per HPI  PE:    05/28/2022    10:18 AM 04/30/2022    8:53 AM 10/24/2020    8:30 AM  Vitals with BMI  Height 6\' 1"  6\' 1"  6\' 0"   Weight 271 lbs 6 oz 271 lbs 6 oz 263 lbs  BMI 35.81 11.91 47.82  Systolic 956 213 086  Diastolic 84 93 83  Pulse 84 65 80     Physical Exam  VS: noted--normal. Gen: alert, NAD, NONTOXIC APPEARING. HEENT: eyes without injection, drainage, or swelling.  Ears: EACs clear, TMs with normal light reflex and landmarks.  Nose: Clear rhinorrhea, with some dried, crusty exudate adherent to mildly injected mucosa.  No purulent d/c.  No paranasal sinus TTP.  No facial swelling.  Throat and mouth without focal lesion.  No pharyngial swelling, erythema, or exudate.   Neck: supple, no LAD.   LUNGS: CTA bilat, nonlabored resps.   CV: RRR, no m/r/g. EXT: no c/c/e SKIN: no rash   LABS:  Last CBC Lab Results  Component Value Date   WBC 4.5 04/30/2022   HGB 17.2 (H) 04/30/2022   HCT 49.3 04/30/2022   MCV 92.4 04/30/2022   RDW 13.1 04/30/2022   PLT 194.0 57/84/6962   Last metabolic panel Lab Results  Component Value Date   GLUCOSE 105 (H) 04/30/2022   NA 139 04/30/2022   K 4.7 04/30/2022   CL 101 04/30/2022   CO2 29 04/30/2022   BUN 11 04/30/2022   CREATININE 1.09 04/30/2022   CALCIUM 10.5 04/30/2022   PROT 7.1 04/30/2022   ALBUMIN 4.8 04/30/2022   BILITOT 0.5 04/30/2022   ALKPHOS 40 04/30/2022   AST 15 04/30/2022   ALT 34 04/30/2022   Last lipids Lab Results  Component Value Date   CHOL 261 (H) 04/30/2022   HDL 58.10 04/30/2022   LDLCALC 170 (H) 04/30/2022   LDLDIRECT 154.0 10/24/2020   TRIG 165.0 (H) 04/30/2022   CHOLHDL 4 04/30/2022   Last hemoglobin A1c Lab Results  Component Value Date   HGBA1C 5.9 04/30/2022   Last thyroid functions Lab Results  Component Value Date   TSH 1.31 04/30/2022   Lab Results  Component Value Date   TESTOSTERONE 278.54 (L) 04/30/2022   Lab Results  Component Value Date   PSA 0.26 04/30/2022   PSA 0.21 10/24/2020   PSA 0.17  09/14/2019   IMPRESSION AND PLAN:  1) Viral URI with cough:  covid neg here today. Symptomatic care: mucinex dm, saline nasal spray, humidifier.  2) HTN, well controlled on enalapril 20mg  qd.  3) major depressive disorder, recurrent. GAD, having some near-panic attacks as well. Change over from citalopram to cymbalta is going well. Cont cymbalta 40mg  qd.  4) Hypotestosteronism. If rpt today is low then will rx testosterone 200mg  IM q2 wks and recheck level 1 wk after 2nd injection.  An After Visit Summary was printed  and given to the patient.  FOLLOW UP: Return for 5-6 wks f/u bp/mood/anx/testost.  Signed:  Crissie Sickles, MD           05/28/2022

## 2022-05-28 NOTE — Addendum Note (Signed)
Addended by: Deveron Furlong D on: 05/28/2022 02:49 PM   Modules accepted: Orders

## 2022-05-30 ENCOUNTER — Encounter: Payer: Self-pay | Admitting: Family Medicine

## 2022-05-30 MED ORDER — TESTOSTERONE CYPIONATE 200 MG/ML IJ SOLN: INTRAMUSCULAR | 1 refills | Status: DC

## 2022-05-30 NOTE — Telephone Encounter (Signed)
Hi Guy Garner, So sorry for the delay. Testosterone level low again, which we expected. I will send testosterone prescription to your pharmacy and you can do this injection every 2 weeks. Make appointment to follow-up with me approximately a week after your third injection. Abbe Amsterdam

## 2022-06-01 ENCOUNTER — Other Ambulatory Visit (HOSPITAL_COMMUNITY): Payer: Self-pay

## 2022-06-01 NOTE — Telephone Encounter (Signed)
Patient Advocate Encounter   Received notification from [facility] that prior authorization for Testosterone cypionate 200mg /ml is required.   PA submitted on 06/01/2022 Key [id number] Status is pending

## 2022-06-01 NOTE — Telephone Encounter (Signed)
noted 

## 2022-06-04 NOTE — Telephone Encounter (Signed)
Noted  

## 2022-06-04 NOTE — Telephone Encounter (Signed)
Insurance will cover, if patient has 2 early morning testosterone checks that are below range. Noted that pt used Goodrx, but another PA can be attempted in the future to hopefully save pt more money.   Pharmacy Patient Advocate Encounter  Received notification from [facility] that the request for prior authorization for Testosterone Cypionate 20MG /ML has been denied due to .

## 2022-06-22 ENCOUNTER — Other Ambulatory Visit: Payer: Self-pay | Admitting: Family Medicine

## 2022-07-09 ENCOUNTER — Ambulatory Visit: Payer: BC Managed Care – PPO | Admitting: Family Medicine

## 2022-07-09 ENCOUNTER — Encounter: Payer: Self-pay | Admitting: Family Medicine

## 2022-07-09 VITALS — BP 132/65 | HR 92 | Temp 97.4°F | Ht 73.0 in | Wt 276.2 lb

## 2022-07-09 DIAGNOSIS — F411 Generalized anxiety disorder: Secondary | ICD-10-CM

## 2022-07-09 DIAGNOSIS — I10 Essential (primary) hypertension: Secondary | ICD-10-CM | POA: Diagnosis not present

## 2022-07-09 DIAGNOSIS — E291 Testicular hypofunction: Secondary | ICD-10-CM

## 2022-07-09 DIAGNOSIS — F3342 Major depressive disorder, recurrent, in full remission: Secondary | ICD-10-CM

## 2022-07-09 DIAGNOSIS — D751 Secondary polycythemia: Secondary | ICD-10-CM | POA: Diagnosis not present

## 2022-07-09 LAB — CBC
HCT: 52.8 % — ABNORMAL HIGH (ref 39.0–52.0)
Hemoglobin: 17.9 g/dL — ABNORMAL HIGH (ref 13.0–17.0)
MCHC: 33.9 g/dL (ref 30.0–36.0)
MCV: 94.4 fl (ref 78.0–100.0)
Platelets: 200 10*3/uL (ref 150.0–400.0)
RBC: 5.59 Mil/uL (ref 4.22–5.81)
RDW: 13.9 % (ref 11.5–15.5)
WBC: 5 10*3/uL (ref 4.0–10.5)

## 2022-07-09 LAB — TESTOSTERONE: Testosterone: 711.09 ng/dL (ref 300.00–890.00)

## 2022-07-09 NOTE — Progress Notes (Signed)
OFFICE VISIT  07/09/2022  CC:  Chief Complaint  Patient presents with   Medical Management of Chronic Issues    Patient is a 57 y.o. male who presents for 6-week follow-up anxiety and depression, hypertension A/P as of last visit: "1) Viral URI with cough:  covid neg here today. Symptomatic care: mucinex dm, saline nasal spray, humidifier.   2) HTN, well controlled on enalapril '20mg'$  qd.   3) major depressive disorder, recurrent. GAD, having some near-panic attacks as well. Change over from citalopram to cymbalta is going well. Cont cymbalta '40mg'$  qd.   4) Hypotestosteronism. If rpt today is low then will rx testosterone '200mg'$  IM q2 wks and recheck level 1 wk after 2nd injection"  INTERIM HX: Tyran is doing well on the testosterone--energy level up, mood up, libido improved, motivated more. Sleep is good.  Denies any adverse effects from the testosterone. He has been taking 0.25 mL injection 2 times a week, most recent dose 4 days ago.  Home blood pressures typically 130s to 150s over 80s. Heart rate ranging 78-102.  ROS as above, plus--> no fevers, no CP, no SOB, no wheezing, no cough, no dizziness, no HAs, no rashes, no melena/hematochezia.  No polyuria or polydipsia.  No myalgias or arthralgias.  No focal weakness, paresthesias, or tremors.  No acute vision or hearing abnormalities.  No dysuria or unusual/new urinary urgency or frequency.  No recent changes in lower legs. No n/v/d or abd pain.  No palpitations.    Past Medical History:  Diagnosis Date   Adult ADHD    Managed by Dr. Johnnye Sima   Anxiety and depression    wellbutrin and citalopram in past   Cellulitis and abscess of leg 03/2015   Right; I&D in ED 03/31/15 (MSSA)   Family history of bicuspid aortic valve    older brother had to get AV replacement   Hyperlipemia, mixed 07/2015; 01/2017; 09/2019   Mild (10 yr framingham risk @ 5%)-no meds indicated. Frm risk 2021 is 6%.   Hypertension 03/2015   IFG (impaired  fasting glucose) 07/2015; 01/2017; 09/2019   Low 100-110.  A1c 5.5-5.7%. 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   HOME SLEEP STUDY  11/02/2019   Dr. Emeline Gins OSA->cpap recommended   Right LL venous doppler u/s Right 11/21/2018   No DVT   WISDOM TOOTH EXTRACTION      Outpatient Medications Prior to Visit  Medication Sig Dispense Refill   ALPRAZolam (XANAX) 0.5 MG tablet Take 0.5 mg by mouth daily.     amphetamine-dextroamphetamine (ADDERALL XR) 30 MG 24 hr capsule Take 30 mg by mouth daily.     aspirin EC 81 MG tablet Take 81 mg by mouth daily.     DULoxetine (CYMBALTA) 20 MG capsule TAKE 2 CAPSULES BY MOUTH EVERY DAY 180 capsule 0   enalapril (VASOTEC) 20 MG tablet 1 tab po bid 180 tablet 3   hydrOXYzine (ATARAX) 25 MG tablet Take 25 mg by mouth daily.     Skin Protectants, Misc. (EUCERIN) cream Apply topically as needed for dry skin. 454 g 0   Testosterone Cypionate 200 MG/ML SOLN 1 ml IM q 2 wks 6  mL 1   MYDAYIS 37.5 MG CP24 Take 1 capsule by mouth daily. (Patient not taking: Reported on 07/09/2022)     No facility-administered medications prior to visit.    Allergies  Allergen Reactions   Amlodipine Swelling    Lower legs swelling   Doxycycline Nausea And Vomiting    Review of Systems As per HPI  PE:    07/09/2022   10:02 AM 07/09/2022    9:46 AM 05/28/2022   10:18 AM  Vitals with BMI  Height  '6\' 1"'$  '6\' 1"'$   Weight  276 lbs 3 oz 271 lbs 6 oz  BMI  0000000 [id number]  Systolic [id number]  Diastolic 65 99 84  Pulse  92 84     Physical Exam  Gen: Alert, well appearing.  Patient is oriented to person, place, time, and situation. AFFECT: pleasant, lucid thought and speech. No  further exam today  LABS:  Last CBC Lab Results  Component Value Date   WBC 4.5 04/30/2022   HGB 17.2 (H) 04/30/2022   HCT 49.3 04/30/2022   MCV 92.4 04/30/2022   RDW 13.1 04/30/2022   PLT 194.0 [id number]   Last metabolic panel Lab Results  Component Value Date   GLUCOSE 105 (H) 04/30/2022   NA 139 04/30/2022   K 4.7 04/30/2022   CL 101 04/30/2022   CO2 29 04/30/2022   BUN 11 04/30/2022   CREATININE 1.09 04/30/2022   CALCIUM 10.5 04/30/2022   PROT 7.1 04/30/2022   ALBUMIN 4.8 04/30/2022   BILITOT 0.5 04/30/2022   ALKPHOS 40 04/30/2022   AST 15 04/30/2022   ALT 34 04/30/2022   Last lipids Lab Results  Component Value Date   CHOL 261 (H) 04/30/2022   HDL 58.10 04/30/2022   LDLCALC 170 (H) 04/30/2022   LDLDIRECT 154.0 10/24/2020   TRIG 165.0 (H) 04/30/2022   CHOLHDL 4 04/30/2022   Last hemoglobin A1c Lab Results  Component Value Date   HGBA1C 5.9 04/30/2022   Last thyroid functions Lab Results  Component Value Date   TSH 1.31 04/30/2022   Lab Results  Component Value Date   TESTOSTERONE 223.93 (L) 05/28/2022   IMPRESSION AND PLAN:  #1 secondary male hypogonadism. Significant clinical improvement on testosterone for the last month or so.  Plan is to continue 0.25 mL injection twice a week. Check testosterone level and hemoglobin today.  He has baseline erythrocytosis due to obstructive sleep apnea so we will have to watch this closely.  2.  Hypertension, not ideal control but he prefers to work on improved cardiovascular exercise and weight loss rather than add medication at this time.  Continue enalapril 20 mg twice a day.  #3 generalized anxiety and recurrent major depressive disorder. Doing great on Cymbalta 40 mg a day.  An After Visit Summary was printed and given to the patient.  FOLLOW UP: Return in about 6 months (around 01/07/2023) for routine chronic illness f/u.  Signed:  Crissie Sickles, MD           07/09/2022

## 2022-07-26 ENCOUNTER — Other Ambulatory Visit: Payer: Self-pay | Admitting: Family Medicine

## 2022-07-26 ENCOUNTER — Telehealth: Payer: Self-pay | Admitting: Family Medicine

## 2022-07-26 MED ORDER — TESTOSTERONE CYPIONATE 200 MG/ML IJ SOLN: INTRAMUSCULAR | 1 refills | Status: DC

## 2022-07-26 NOTE — Telephone Encounter (Signed)
Pt called, requested new testosterone rx in order to accommodate the way he is taking it and the small amount that is not available in the bottle after each dose (takes 0.'25mg'$  twice a week).  He will run out of current rx early d/t this.  I did a new prescription today for testosterone: Sig 1 mL weekly, 6 mL, refill x 1.

## 2022-08-17 ENCOUNTER — Other Ambulatory Visit: Payer: Self-pay | Admitting: Family Medicine

## 2022-08-17 MED ORDER — DULOXETINE HCL 20 MG PO CPEP: ORAL_CAPSULE | ORAL | 0 refills | Status: DC

## 2022-08-20 NOTE — Telephone Encounter (Signed)
Please complete PA

## 2022-08-22 ENCOUNTER — Encounter: Payer: Self-pay | Admitting: Family Medicine

## 2022-08-23 NOTE — Telephone Encounter (Signed)
No further action needed at this time.

## 2022-08-30 ENCOUNTER — Other Ambulatory Visit (HOSPITAL_COMMUNITY): Payer: Self-pay

## 2022-08-30 NOTE — Telephone Encounter (Signed)
Insurance will pay for 60 mg once a day or 2, 30 mg capsules daily, please change if appropriate or advise to continue PA for quantity limit override as insurance will only pay for 2, 20 mg capsules daily.

## 2022-09-03 NOTE — Telephone Encounter (Signed)
Hi [place]ar[place], I have no problem prescribing Ozempic or Mounjaro (or anything in that category of medication) for you.  However, insurance will deny coverage unless it is being used for treatment of type 2 diabetes.

## 2022-11-12 DIAGNOSIS — F902 Attention-deficit hyperactivity disorder, combined type: Secondary | ICD-10-CM | POA: Diagnosis not present

## 2022-11-12 DIAGNOSIS — Z79899 Other long term (current) drug therapy: Secondary | ICD-10-CM | POA: Diagnosis not present

## 2022-11-13 DIAGNOSIS — F902 Attention-deficit hyperactivity disorder, combined type: Secondary | ICD-10-CM | POA: Diagnosis not present

## 2022-11-27 ENCOUNTER — Other Ambulatory Visit: Payer: Self-pay | Admitting: Family Medicine

## 2022-11-27 ENCOUNTER — Other Ambulatory Visit: Payer: Self-pay

## 2022-11-27 ENCOUNTER — Encounter: Payer: Self-pay | Admitting: Family Medicine

## 2022-11-27 MED ORDER — TESTOSTERONE CYPIONATE 200 MG/ML IJ SOLN: INTRAMUSCULAR | 3 refills | Status: DC

## 2022-11-27 MED ORDER — DULOXETINE HCL 20 MG PO CPEP: ORAL_CAPSULE | ORAL | 0 refills | Status: DC

## 2022-11-27 NOTE — Telephone Encounter (Signed)
Requesting: Testosterone Cypionate 200 MG/ML SOLN  Contract: N/A UDS: testosterone level 2/26 Last Visit: 07/09/22 Next Visit: 01/07/23 Last Refill: 07/26/22 (87ml,1)  Please Advise. Med pending

## 2022-12-12 ENCOUNTER — Encounter (INDEPENDENT_AMBULATORY_CARE_PROVIDER_SITE_OTHER): Payer: Self-pay

## 2023-01-07 ENCOUNTER — Ambulatory Visit: Payer: BC Managed Care – PPO | Admitting: Family Medicine

## 2023-01-22 ENCOUNTER — Ambulatory Visit: Payer: BC Managed Care – PPO | Admitting: Family Medicine

## 2023-01-30 ENCOUNTER — Encounter: Payer: Self-pay | Admitting: Family Medicine

## 2023-02-04 ENCOUNTER — Ambulatory Visit: Payer: BC Managed Care – PPO | Admitting: Family Medicine

## 2023-02-04 NOTE — Progress Notes (Deleted)
OFFICE VISIT  02/04/2023  CC: No chief complaint on file.   Patient is a 57 y.o. male who presents for 77-month follow-up male hypogonadism, hypertension, and anxiety and depression. A/P as of last visit: "1 secondary male hypogonadism. Significant clinical improvement on testosterone for the last month or so.  Plan is to continue 0.25 mL injection twice a week. Check testosterone level and hemoglobin today.  He has baseline erythrocytosis due to obstructive sleep apnea so we will have to watch this closely.   2.  Hypertension, not ideal control but he prefers to work on improved cardiovascular exercise and weight loss rather than add medication at this time.  Continue enalapril 20 mg twice a day.   #3 generalized anxiety and recurrent major depressive disorder. Doing great on Cymbalta 40 mg a day."  INTERIM HX: ***   PMP AWARE reviewed today: most recent rx for   testosterone cypionate was filled 01/09/2023 # 6 mL, rx by me. No red flags.  Past Medical History:  Diagnosis Date   Adult ADHD    Managed by Dr. Elisabeth Most   Anxiety and depression    wellbutrin and citalopram in past   Cellulitis and abscess of leg 03/2015   Right; I&D in ED 03/31/15 (MSSA)   Family history of bicuspid aortic valve    older brother had to get AV replacement   Hyperlipemia, mixed 07/2015; 01/2017; 09/2019   Mild (10 yr framingham risk @ 5%)-no meds indicated. Frm risk 2021 is 6%.   Hypertension 03/2015   IFG (impaired fasting glucose) 07/2015; 01/2017; 09/2019   Low 100-110.  A1c 5.5-5.7%. 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   HOME SLEEP STUDY  11/02/2019   Dr. Kathlen Brunswick OSA->cpap recommended   Right LL  venous doppler u/s Right 11/21/2018   No DVT   WISDOM TOOTH EXTRACTION      Outpatient Medications Prior to Visit  Medication Sig Dispense Refill   ALPRAZolam (XANAX) 0.5 MG tablet Take 0.5 mg by mouth daily.     amphetamine-dextroamphetamine (ADDERALL XR) 30 MG 24 hr capsule Take 30 mg by mouth daily.     aspirin EC 81 MG tablet Take 81 mg by mouth daily.     DULoxetine (CYMBALTA) 20 MG capsule 2 caps po qAM and 1 cap po qPM 180 capsule 0   enalapril (VASOTEC) 20 MG tablet 1 tab po bid 180 tablet 3   hydrOXYzine (ATARAX) 25 MG tablet Take 25 mg by mouth daily.     Skin Protectants, Misc. (EUCERIN) cream Apply topically as needed for dry skin. 454 g 0   Testosterone Cypionate 200 MG/ML SOLN 1 ml IM q week 6 mL 3   MYDAYIS 37.5 MG CP24 Take 1 capsule by mouth daily. (Patient not taking: Reported on 07/09/2022)     No facility-administered medications prior to visit.    Allergies  Allergen Reactions   Amlodipine Swelling    Lower legs swelling   Doxycycline Nausea And Vomiting    Review of Systems As per HPI  PE:    07/09/2022   10:02 AM 07/09/2022    9:46 AM 05/28/2022   10:18 AM  Vitals with  BMI  Height  6\' 1"  6\' 1"   Weight  276 lbs 3 oz 271 lbs 6 oz  BMI  36.45 35.81  Systolic 132 167 621  Diastolic 65 99 84  Pulse  92 84     Physical Exam  ***  LABS:  Last CBC Lab Results  Component Value Date   WBC 5.0 07/09/2022   HGB 17.9 (H) 07/09/2022   HCT 52.8 (H) 07/09/2022   MCV 94.4 07/09/2022   RDW 13.9 07/09/2022   PLT 200.0 07/09/2022   Last metabolic panel Lab Results  Component Value Date   GLUCOSE 105 (H) 04/30/2022   NA 139 04/30/2022   K 4.7 04/30/2022   CL 101 04/30/2022   CO2 29 04/30/2022   BUN 11 04/30/2022   CREATININE 1.09 04/30/2022   GFR 75.86 04/30/2022   CALCIUM 10.5 04/30/2022   PROT 7.1 04/30/2022   ALBUMIN 4.8 04/30/2022   BILITOT 0.5 04/30/2022   ALKPHOS 40 04/30/2022   AST 15 04/30/2022   ALT 34 04/30/2022   Last  lipids Lab Results  Component Value Date   CHOL 261 (H) 04/30/2022   HDL 58.10 04/30/2022   LDLCALC 170 (H) 04/30/2022   LDLDIRECT 154.0 10/24/2020   TRIG 165.0 (H) 04/30/2022   CHOLHDL 4 04/30/2022   Last hemoglobin A1c Lab Results  Component Value Date   HGBA1C 5.9 04/30/2022   Last thyroid functions Lab Results  Component Value Date   TSH 1.31 04/30/2022   Lab Results  Component Value Date   TESTOSTERONE 711.09 07/09/2022   Lab Results  Component Value Date   PSA 0.26 04/30/2022   PSA 0.21 10/24/2020   PSA 0.17 09/14/2019   IMPRESSION AND PLAN:  No problem-specific Assessment & Plan notes found for this encounter.   An After Visit Summary was printed and given to the patient.  FOLLOW UP: No follow-ups on file. Next cpe 04/2023 Signed:  Santiago Bumpers, MD           02/04/2023

## 2023-02-11 ENCOUNTER — Encounter: Payer: Self-pay | Admitting: Family Medicine

## 2023-02-11 ENCOUNTER — Ambulatory Visit: Payer: BC Managed Care – PPO | Admitting: Family Medicine

## 2023-02-11 VITALS — BP 120/70 | HR 76 | Temp 98.0°F | Ht 73.0 in | Wt 267.2 lb

## 2023-02-11 DIAGNOSIS — F411 Generalized anxiety disorder: Secondary | ICD-10-CM

## 2023-02-11 DIAGNOSIS — I10 Essential (primary) hypertension: Secondary | ICD-10-CM | POA: Diagnosis not present

## 2023-02-11 DIAGNOSIS — E291 Testicular hypofunction: Secondary | ICD-10-CM | POA: Diagnosis not present

## 2023-02-11 DIAGNOSIS — F3342 Major depressive disorder, recurrent, in full remission: Secondary | ICD-10-CM

## 2023-02-11 LAB — BASIC METABOLIC PANEL
BUN: 12 mg/dL (ref 6–23)
CO2: 29 meq/L (ref 19–32)
Calcium: 10 mg/dL (ref 8.4–10.5)
Chloride: 101 meq/L (ref 96–112)
Creatinine, Ser: 1.05 mg/dL (ref 0.40–1.50)
GFR: 78.9 mL/min (ref >60.00)
Glucose, Bld: 86 mg/dL (ref 70–99)
Potassium: 4.5 meq/L (ref 3.5–5.1)
Sodium: 139 meq/L (ref 135–145)

## 2023-02-11 LAB — CBC
HCT: 49.4 % (ref 39.0–52.0)
Hemoglobin: 15.3 g/dL (ref 13.0–17.0)
MCHC: 30.9 g/dL (ref 30.0–36.0)
MCV: 80.1 fL (ref 78.0–100.0)
Platelets: 270 10*3/uL (ref 150.0–400.0)
RBC: 6.17 Mil/uL — ABNORMAL HIGH (ref 4.22–5.81)
RDW: 15.8 % — ABNORMAL HIGH (ref 11.5–15.5)
WBC: 6.1 10*3/uL (ref 4.0–10.5)

## 2023-02-11 LAB — TESTOSTERONE: Testosterone: 1359.36 ng/dL — ABNORMAL HIGH (ref 300.00–890.00)

## 2023-02-11 MED ORDER — DULOXETINE HCL 20 MG PO CPEP: ORAL_CAPSULE | ORAL | 1 refills | Status: DC

## 2023-02-11 NOTE — Progress Notes (Signed)
OFFICE VISIT  02/11/2023  CC:  Chief Complaint  Patient presents with   Medical Management of Chronic Issues    Pt would like to discuss testosterone dose change    Patient is a 57 y.o. 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  INTERIM HX: Guy Garner feels like he is doing pretty well overall.  He is under some stress more lately with finances, selling his townhome, paying off some debt. He does feel like the testosterone has been helpful but is still feeling like energy level is suboptimal. He is trying to get into the habit of working out regularly and is making some dietary improvements.  Current duloxetine dosing: 20 mg twice daily.  No panic attacks or disabling anxiety.  He is mood has been good.  Current testosterone regimen: 0.25 to 0.3 ml 2 times a week. Then, 3 weeks ago he added 1/3 injection of the same dose each week. Most recent testosterone injection: 2 d/a.  PMP AWARE reviewed today: most recent rx for testosterone cypionate was filled 01/09/2023, # 6 mL, rx by me. No red flags.  Past Medical History:  Diagnosis Date   Adult ADHD    Managed by Dr. Elisabeth Most   Anxiety and depression    wellbutrin and citalopram in past   Cellulitis and abscess of leg 03/2015   Right; I&D in ED 03/31/15 (MSSA)   Family history of bicuspid aortic valve    older brother had to get AV replacement   Hyperlipemia, mixed 07/2015; 01/2017; 09/2019   Mild (10 yr framingham risk @ 5%)-no meds indicated. Frm risk 2021 is 6%.   Hypertension 03/2015   IFG (impaired fasting glucose) 07/2015; 01/2017; 09/2019   Low 100-110.  A1c 5.5-5.7%. Upt to 5.9% June 2022 and 04/2022   Insomnia    ambien in past   OSA (obstructive sleep apnea) 2021   11/02/19 home sleep study: mod-to-sev, needs CPAP   Secondary male hypogonadism    04/2022    Past Surgical History:  Procedure Laterality Date   coronary calcium score  10/2020   57th  %'tile-->no statin recommended.   HOME SLEEP STUDY  11/02/2019   Dr. Kathlen Brunswick OSA->cpap recommended   Right LL venous doppler u/s Right 11/21/2018   No DVT   WISDOM TOOTH EXTRACTION      Outpatient Medications Prior to Visit  Medication Sig Dispense Refill   ALPRAZolam (XANAX) 0.5 MG tablet Take 0.5 mg by mouth daily.     amphetamine-dextroamphetamine (ADDERALL XR) 30 MG 24 hr capsule Take 30 mg by mouth daily.     aspirin EC 81 MG tablet Take 81 mg by mouth daily.     DULoxetine (CYMBALTA) 20 MG capsule 2 caps po qAM and 1 cap po qPM 180 capsule 0   enalapril (VASOTEC) 20 MG tablet 1 tab po bid 180 tablet 3   hydrOXYzine (ATARAX) 25 MG tablet Take 25 mg by mouth daily.     Skin Protectants, Misc. (EUCERIN) cream Apply topically as needed for dry skin. 454 g 0   Testosterone Cypionate 200 MG/ML SOLN 1 ml IM q week 6 mL 3   No facility-administered medications prior to visit.    Allergies  Allergen Reactions   Amlodipine Swelling    Lower legs swelling   Doxycycline Nausea And Vomiting    Review of Systems As per HPI  PE:    02/11/2023   10:04 AM 07/09/2022   10:02 AM 07/09/2022  9:46 AM  Vitals with BMI  Height 6\' 1"   6\' 1"   Weight 267 lbs 3 oz  276 lbs 3 oz  BMI 35.26  36.45  Systolic 120 132 161  Diastolic 70 65 99  Pulse 76  92     Physical Exam  Gen: Alert, well appearing.  Patient is oriented to person, place, time, and situation. AFFECT: pleasant, lucid thought and speech. No further exam today  LABS:  Last CBC Lab Results  Component Value Date   WBC 5.0 07/09/2022   HGB 17.9 (H) 07/09/2022   HCT 52.8 (H) 07/09/2022   MCV 94.4 07/09/2022   RDW 13.9 07/09/2022   PLT 200.0 07/09/2022   Lab Results  Component Value Date   IRON 132 05/01/2022   TIBC 350.0 05/01/2022   FERRITIN 72.2 05/01/2022   Last metabolic panel Lab Results  Component Value Date   GLUCOSE 105 (H) 04/30/2022   NA 139 04/30/2022   K 4.7 04/30/2022   CL 101  04/30/2022   CO2 29 04/30/2022   BUN 11 04/30/2022   CREATININE 1.09 04/30/2022   GFR 75.86 04/30/2022   CALCIUM 10.5 04/30/2022   PROT 7.1 04/30/2022   ALBUMIN 4.8 04/30/2022   BILITOT 0.5 04/30/2022   ALKPHOS 40 04/30/2022   AST 15 04/30/2022   ALT 34 04/30/2022   Last lipids Lab Results  Component Value Date   CHOL 261 (H) 04/30/2022   HDL 58.10 04/30/2022   LDLCALC 170 (H) 04/30/2022   LDLDIRECT 154.0 10/24/2020   TRIG 165.0 (H) 04/30/2022   CHOLHDL 4 04/30/2022   Last hemoglobin A1c Lab Results  Component Value Date   HGBA1C 5.9 04/30/2022   Last thyroid functions Lab Results  Component Value Date   TSH 1.31 04/30/2022   Lab Results  Component Value Date   PSA 0.26 04/30/2022   PSA 0.21 10/24/2020   PSA 0.17 09/14/2019   IMPRESSION AND PLAN:  #1 anxiety and depression. Doing well on duloxetine 20 mg twice a day.  He does have Xanax that he uses as needed that is prescribed by his provider at [facility].  2.  Secondary male hypogonadism. He is getting a pretty good response from testosterone.  He divides his 100 mg weekly dose into 30mg  3/week. Monitor testosterone level today and hemoglobin level. Of note, his hemoglobin has been chronically mildly elevated, most likely due to untreated obstructive sleep apnea (he cannot tolerate wearing his CPAP).  #3 hypertension, well-controlled on enalapril 20 mg twice a day. Monitor electrolytes and creatinine today.  An After Visit Summary was printed and given to the patient.  FOLLOW UP: No follow-ups on file. Next CPE 04/2023 or January 2025 Signed:  Santiago Bumpers, MD           02/11/2023

## 2023-02-13 NOTE — Telephone Encounter (Signed)
Hi Rivers,  Start taking vitamin D 1000 unit tab every day. We can check the level next time you are here. As far as routine labs we should check a CBC and testosterone level every 6 months. The recent testosterone level was a little elevated but nothing to worry about.  I think staying on your current testosterone regimen is fine. Low testosterone is not hereditary.  You can send me the testosterone supplement that your nephew is on. If we considered switching to a different testosterone than your injections that would be fine but we do have to go through the insurance prior authorization hoops again.  I have no problem with that but I know sometimes that can be very inconvenient for patients.  -Michele Mcalpine

## 2023-02-15 NOTE — Telephone Encounter (Signed)
Hi Guy Garner, The only way to possibly get phlebotomy more often than 2 mo is to see the hematology specialist and they would do it via their office.  However, they will not necessarily do it every month. We did do a cbc on 9/30 blood work.  Your Hb is 15.3 which is much better than it has been.  In fact, based on this level you would not necessarily need to donate blood but it would not hurt.  It is fine to continue the testosterone 0.3 ml three times a week.  Your T level is not at a level that I want you to worry about.  As far as the phentermine goes, everyone can respond to meds differently.  That being said, in my experience phentermine does not work nearly as effectively as adderall for ADD.  Appetite suppression may be similar with each. I can't emphasize enough the fact that exercising will make you feel much better physically and mentally, as hard as it is.  I realize that seeing the pounds come off is important but the benefits go [facility] further than that.  All the supplements, DHEA, etc is nothing I would recommend. The simpler the better.    Michele Mcalpine

## 2023-02-18 DIAGNOSIS — F4323 Adjustment disorder with mixed anxiety and depressed mood: Secondary | ICD-10-CM | POA: Diagnosis not present

## 2023-02-18 NOTE — Telephone Encounter (Signed)
Going from 40 duloxetine to the 20 mg dose would be fine.

## 2023-04-23 ENCOUNTER — Encounter: Payer: Self-pay | Admitting: Family Medicine

## 2023-04-24 ENCOUNTER — Telehealth: Payer: BC Managed Care – PPO | Admitting: Family Medicine

## 2023-04-24 DIAGNOSIS — J019 Acute sinusitis, unspecified: Secondary | ICD-10-CM

## 2023-04-24 DIAGNOSIS — B9689 Other specified bacterial agents as the cause of diseases classified elsewhere: Secondary | ICD-10-CM | POA: Diagnosis not present

## 2023-04-24 MED ORDER — AMOXICILLIN-POT CLAVULANATE 875-125 MG PO TABS
1.0000 | ORAL_TABLET | Freq: Two times a day (BID) | ORAL | 0 refills | Status: AC
Start: 2023-04-24 — End: 2023-05-01

## 2023-04-24 NOTE — Progress Notes (Signed)

## 2023-04-24 NOTE — Telephone Encounter (Signed)
No further action needed at this time.

## 2023-04-26 ENCOUNTER — Ambulatory Visit: Payer: BC Managed Care – PPO | Admitting: Family Medicine

## 2023-04-27 ENCOUNTER — Encounter: Payer: Self-pay | Admitting: Family Medicine

## 2023-04-29 ENCOUNTER — Other Ambulatory Visit: Payer: Self-pay | Admitting: Family Medicine

## 2023-05-06 ENCOUNTER — Encounter: Payer: Self-pay | Admitting: Family Medicine

## 2023-05-07 ENCOUNTER — Ambulatory Visit: Payer: BC Managed Care – PPO | Admitting: Family Medicine

## 2023-06-29 ENCOUNTER — Other Ambulatory Visit: Payer: Self-pay | Admitting: Family Medicine

## 2023-07-01 DIAGNOSIS — Z79899 Other long term (current) drug therapy: Secondary | ICD-10-CM | POA: Diagnosis not present

## 2023-07-01 DIAGNOSIS — F902 Attention-deficit hyperactivity disorder, combined type: Secondary | ICD-10-CM | POA: Diagnosis not present

## 2023-07-01 NOTE — Telephone Encounter (Signed)
 Refill requested for Testosterone sent to [facility] in [place]. Next OV 08/12/23

## 2023-07-03 ENCOUNTER — Telehealth: Payer: Self-pay

## 2023-07-03 NOTE — Telephone Encounter (Signed)
 Pharmacy Patient Advocate Encounter   Received notification from Fax that prior authorization for Testosterone Cypionate 200MG /ML intramuscular solution is required/requested.   Insurance verification completed.   The patient is insured through [facility] .   Per test claim: PA required; PA submitted to above mentioned insurance via CoverMyMeds Key/confirmation #/EOC  [id number] Status is pending

## 2023-07-03 NOTE — Telephone Encounter (Signed)
 Pharmacy Patient Advocate Encounter   Received notification from  [employer]  that prior authorization for Testosterone Cypionate 200MG /ML intramuscular solution is required/requested.   Insurance verification completed.   The patient is insured through [facility] .   Per test claim: PA required; PA submitted to above mentioned insurance via CoverMyMeds Key/confirmation #/EOC [id number] Status is pending

## 2023-07-05 NOTE — Telephone Encounter (Signed)
 Pharmacy Patient Advocate Encounter  Received notification from [facility] that Prior Authorization for Testosterone inj. has been DENIED.  Full denial letter will be uploaded to the media tab. See denial reason below.    PA #/Case ID/Reference #: [id number]

## 2023-07-05 NOTE — Telephone Encounter (Signed)
 FYI  Please see below

## 2023-07-08 NOTE — Telephone Encounter (Signed)
 DUPLICATE ENCOUNTER.  Please see other telephone encounter.

## 2023-07-31 ENCOUNTER — Other Ambulatory Visit: Payer: Self-pay | Admitting: Family Medicine

## 2023-08-12 ENCOUNTER — Ambulatory Visit: Payer: BC Managed Care – PPO | Admitting: Family Medicine

## 2023-08-19 ENCOUNTER — Ambulatory Visit: Admitting: Family Medicine

## 2023-08-20 ENCOUNTER — Ambulatory Visit: Admitting: Family Medicine

## 2023-08-31 ENCOUNTER — Other Ambulatory Visit: Payer: Self-pay | Admitting: Family Medicine

## 2023-09-16 ENCOUNTER — Encounter: Admitting: Family Medicine

## 2023-10-01 ENCOUNTER — Ambulatory Visit: Admitting: Family Medicine

## 2023-10-02 ENCOUNTER — Encounter: Payer: Self-pay | Admitting: Family Medicine

## 2023-10-10 ENCOUNTER — Other Ambulatory Visit: Payer: Self-pay | Admitting: Family Medicine

## 2023-10-11 ENCOUNTER — Other Ambulatory Visit: Payer: Self-pay

## 2023-10-11 DIAGNOSIS — S0501XA Injury of conjunctiva and corneal abrasion without foreign body, right eye, initial encounter: Secondary | ICD-10-CM | POA: Diagnosis not present

## 2023-10-11 MED ORDER — ENALAPRIL MALEATE 20 MG PO TABS: 20.0000 mg | ORAL_TABLET | Freq: Two times a day (BID) | ORAL | 1 refills | Status: DC

## 2023-11-08 ENCOUNTER — Other Ambulatory Visit: Payer: Self-pay | Admitting: Family Medicine

## 2023-11-18 ENCOUNTER — Encounter: Admitting: Family Medicine

## 2023-12-13 ENCOUNTER — Encounter: Payer: Self-pay | Admitting: Family Medicine

## 2023-12-17 ENCOUNTER — Ambulatory Visit: Payer: Self-pay | Admitting: Family Medicine

## 2023-12-17 ENCOUNTER — Ambulatory Visit: Admitting: Family Medicine

## 2023-12-17 VITALS — BP 110/74 | HR 66 | Temp 97.8°F | Ht 73.0 in | Wt 247.4 lb

## 2023-12-17 DIAGNOSIS — E782 Mixed hyperlipidemia: Secondary | ICD-10-CM

## 2023-12-17 DIAGNOSIS — E291 Testicular hypofunction: Secondary | ICD-10-CM | POA: Diagnosis not present

## 2023-12-17 DIAGNOSIS — Z0000 Encounter for general adult medical examination without abnormal findings: Secondary | ICD-10-CM | POA: Diagnosis not present

## 2023-12-17 DIAGNOSIS — R7303 Prediabetes: Secondary | ICD-10-CM

## 2023-12-17 DIAGNOSIS — I10 Essential (primary) hypertension: Secondary | ICD-10-CM | POA: Diagnosis not present

## 2023-12-17 DIAGNOSIS — Z125 Encounter for screening for malignant neoplasm of prostate: Secondary | ICD-10-CM

## 2023-12-17 DIAGNOSIS — Z1211 Encounter for screening for malignant neoplasm of colon: Secondary | ICD-10-CM

## 2023-12-17 LAB — CBC WITH DIFFERENTIAL/PLATELET
Basophils Absolute: 0 K/uL (ref 0.0–0.1)
Basophils Relative: 0.9 % (ref 0.0–3.0)
Eosinophils Absolute: 0.3 K/uL (ref 0.0–0.7)
Eosinophils Relative: 5.1 % — ABNORMAL HIGH (ref 0.0–5.0)
HCT: 51.9 % (ref 39.0–52.0)
Hemoglobin: 16.5 g/dL (ref 13.0–17.0)
Lymphocytes Relative: 32.2 % (ref 12.0–46.0)
Lymphs Abs: 1.7 K/uL (ref 0.7–4.0)
MCHC: 31.8 g/dL (ref 30.0–36.0)
MCV: 79.7 fl (ref 78.0–100.0)
Monocytes Absolute: 0.5 K/uL (ref 0.1–1.0)
Monocytes Relative: 9.9 % (ref 3.0–12.0)
Neutro Abs: 2.7 K/uL (ref 1.4–7.7)
Neutrophils Relative %: 51.9 % (ref 43.0–77.0)
Platelets: 233 K/uL (ref 150.0–400.0)
RBC: 6.51 Mil/uL — ABNORMAL HIGH (ref 4.22–5.81)
RDW: 18.8 % — ABNORMAL HIGH (ref 11.5–15.5)
WBC: 5.3 K/uL (ref 4.0–10.5)

## 2023-12-17 LAB — COMPREHENSIVE METABOLIC PANEL WITH GFR
ALT: 43 U/L (ref 0–53)
AST: 23 U/L (ref 0–37)
Albumin: 4.9 g/dL (ref 3.5–5.2)
Alkaline Phosphatase: 39 U/L (ref 39–117)
BUN: 12 mg/dL (ref 6–23)
CO2: 31 meq/L (ref 19–32)
Calcium: 9.8 mg/dL (ref 8.4–10.5)
Chloride: 101 meq/L (ref 96–112)
Creatinine, Ser: 1.13 mg/dL (ref 0.40–1.50)
GFR: 71.82 mL/min (ref >60.00)
Glucose, Bld: 87 mg/dL (ref 70–99)
Potassium: 4.9 meq/L (ref 3.5–5.1)
Sodium: 137 meq/L (ref 135–145)
Total Bilirubin: 0.6 mg/dL (ref 0.2–1.2)
Total Protein: 7.4 g/dL (ref 6.0–8.3)

## 2023-12-17 LAB — LIPID PANEL
Cholesterol: 168 mg/dL (ref 0–200)
HDL: 42.9 mg/dL (ref >39.00)
LDL Cholesterol: 105 mg/dL — ABNORMAL HIGH (ref 0–99)
NonHDL: 124.78
Total CHOL/HDL Ratio: 4
Triglycerides: 101 mg/dL (ref 0.0–149.0)
VLDL: 20.2 mg/dL (ref 0.0–40.0)

## 2023-12-17 LAB — PSA: PSA: 0.57 ng/mL (ref 0.10–4.00)

## 2023-12-17 LAB — TSH: TSH: 1.36 u[IU]/mL (ref 0.35–5.50)

## 2023-12-17 LAB — TESTOSTERONE: Testosterone: 709.9 ng/dL (ref 300.00–890.00)

## 2023-12-17 LAB — HEMOGLOBIN A1C: Hgb A1c MFr Bld: 6 % (ref 4.6–6.5)

## 2023-12-17 MED ORDER — DULOXETINE HCL 20 MG PO CPEP: 20.0000 mg | ORAL_CAPSULE | Freq: Two times a day (BID) | ORAL | 1 refills | Status: AC

## 2023-12-17 NOTE — Progress Notes (Signed)
 Office Note 12/17/2023  CC:  Chief Complaint  Patient presents with   Annual Exam    Pt is fasting   Patient is a 58 y.o. 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 I last saw him on 02/11/2023. A/P as of that visit: #1 anxiety and depression. Doing well on duloxetine  20 mg twice a day.  He does have Xanax that he uses as needed that is prescribed by his provider at [facility].   2.  Secondary male hypogonadism. He is getting a pretty good response from testosterone .  He divides his 100 mg weekly dose into 30mg  3/week. Monitor testosterone  level today and hemoglobin level. Of note, his hemoglobin has been chronically mildly elevated, most likely due to untreated obstructive sleep apnea (he cannot tolerate wearing his CPAP).   #3 hypertension, well-controlled on enalapril  20 mg twice a day. Monitor electrolytes and creatinine today.  INTERIM HX: Guy Garner is doing well. He recently started retatrutide, rx'd through an [employer], compounded.  4 mg/week injection.  He feels no appetite on it, feels digestive system slowed, some constipation. Very pleased with its effects.  Has lost 20 pounds in the last 6 weeks or so.  No home blood pressure monitoring. He wears CPAP but says average use is 4-5 hours or night or so  Taking testosterone  100 mg weekly. Most recent testosterone  injection was: 5 d/a. He feels like he is significant benefits from the medication--> libido, energy level, mood, sleep. He does feel like he has been losing hair a bit more rapidly since being on testosterone . He has been taking saw palmetto to try to combat this.  PMP AWARE: Most recent testosterone  prescription was filled 10/15/2023, 6 mL, prescription by me. his Adderall and Ambien are prescribed by Sherlean Manning. no red flags.  Past Medical History:  Diagnosis Date   Adult ADHD    Managed  by Dr. Elouise   Anxiety and depression    wellbutrin and citalopram  in past   Cellulitis and abscess of leg 03/2015   Right; I&D in ED 03/31/15 (MSSA)   Family history of bicuspid aortic valve    older brother had to get AV replacement   Hyperlipemia, mixed 07/2015; 01/2017; 09/2019   Mild (10 yr framingham risk @ 5%)-no meds indicated. Frm risk 2021 is 6%.   Hypertension 03/2015   IFG (impaired fasting glucose) 07/2015; 01/2017; 09/2019   Low 100-110.  A1c 5.5-5.7%. 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   HOME SLEEP STUDY  11/02/2019   Dr. Antoniette OSA->cpap recommended   Right LL venous doppler u/s Right 11/21/2018   No DVT   WISDOM TOOTH EXTRACTION      Family History  Problem Relation Age of Onset   Hypothyroidism Mother    Dementia Mother    Heart attack Father    Diabetes Father    Hyperlipidemia Father    Heart disease Father    Hypertension Father    Diabetes Brother    Stroke Brother    Diabetes Maternal Grandmother    Arthritis Paternal Grandmother    Colon cancer Paternal Grandmother  dx mid-70's   Cancer Paternal Grandmother    Stroke Paternal Grandfather     Social History   Socioeconomic History   Marital status: Single    Spouse name: Not on file   Number of children: Not on file   Years of education: Not on file   Highest education level: Associate degree: occupational, Scientist, product/process development, or vocational program  Occupational History   Not on file  Tobacco Use   Smoking status: Never   Smokeless tobacco: Never  Vaping Use   Vaping status: Never Used  Substance and Sexual Activity   Alcohol use: Yes    Comment: occ   Drug use: No   Sexual activity: Not  on file  Other Topics Concern   Not on file  Social History Narrative   Divorced, 1 daughter.   Occupation: Hairdresser/stylist.   No T/A/Ds.   Social Drivers of Corporate investment banker Strain: Low Risk  (12/17/2023)   Overall Financial Resource Strain (CARDIA)    Difficulty of Paying Living Expenses: Not very hard  Food Insecurity: No Food Insecurity (12/17/2023)   Hunger Vital Sign    Worried About Running Out of Food in the Last Year: Never true    Ran Out of Food in the Last Year: Never true  Transportation Needs: No Transportation Needs (12/17/2023)   PRAPARE - Administrator, Civil Service (Medical): No    Lack of Transportation (Non-Medical): No  Physical Activity: Inactive (12/17/2023)   Exercise Vital Sign    Days of Exercise per Week: 0 days    Minutes of Exercise per Session: Not on file  Stress: No Stress Concern Present (12/17/2023)   [employer] of Occupational Health - Occupational Stress Questionnaire    Feeling of Stress: Not at all  Social Connections: Moderately Isolated (12/17/2023)   Social Connection and Isolation Panel    Frequency of Communication with Friends and Family: More than three times a week    Frequency of Social Gatherings with Friends and Family: Twice a week    Attends Religious Services: More than 4 times per year    Active Member of [employer] or Organizations: No    Attends Engineer, structural: Not on file    Marital Status: Divorced  Intimate Partner Violence: Not on file    Outpatient Medications Prior to Visit  Medication Sig Dispense Refill   ALPRAZolam (XANAX) 0.5 MG tablet Take 0.5 mg by mouth daily.     amphetamine-dextroamphetamine (ADDERALL XR) 30 MG 24 hr capsule Take 30 mg by mouth daily.     aspirin EC 81 MG tablet Take 81 mg by mouth daily.     DULoxetine  (CYMBALTA ) 20 MG capsule TAKE 1 CAPSULE BY MOUTH TWICE A DAY 60 capsule 0   enalapril  (VASOTEC ) 20 MG tablet Take 1 tablet (20 mg total) by mouth 2 (two)  times daily. 60 tablet 1   hydrOXYzine (ATARAX) 25 MG tablet Take 25 mg by mouth daily.     Skin Protectants, Misc. (EUCERIN) cream Apply topically as needed for dry skin. 454 g 0   testosterone  cypionate (DEPOTESTOSTERONE CYPIONATE) 200 MG/ML injection INJECT 1 ML INTRAMUSCULARLY ONE TIME PER WEEK 6 mL 3   zolpidem (AMBIEN) 10 MG tablet Take 10 mg by mouth at bedtime.     No facility-administered medications prior to visit.    Allergies  Allergen Reactions   Amlodipine  Swelling    Lower legs swelling   Doxycycline  Nausea And Vomiting  Review of Systems  Constitutional:  Negative for appetite change, chills, fatigue and fever.  HENT:  Negative for congestion, dental problem, ear pain and sore throat.   Eyes:  Negative for discharge, redness and visual disturbance.  Respiratory:  Negative for cough, chest tightness, shortness of breath and wheezing.   Cardiovascular:  Negative for chest pain, palpitations and leg swelling.  Gastrointestinal:  Negative for abdominal pain, blood in stool, diarrhea, nausea and vomiting.  Genitourinary:  Negative for difficulty urinating, dysuria, flank pain, frequency, hematuria and urgency.  Musculoskeletal:  Negative for arthralgias, back pain, joint swelling, myalgias and neck stiffness.  Skin:  Negative for pallor and rash.  Neurological:  Negative for dizziness, speech difficulty, weakness and headaches.  Hematological:  Negative for adenopathy. Does not bruise/bleed easily.  Psychiatric/Behavioral:  Negative for confusion and sleep disturbance. The patient is not nervous/anxious.     PE;    12/17/2023    8:28 AM 02/11/2023   10:04 AM 07/09/2022   10:02 AM  Vitals with BMI  Height 6' 1 6' 1   Weight 247 lbs 6 oz 267 lbs 3 oz   BMI 32.65 35.26   Systolic 110 120 867  Diastolic 74 70 65  Pulse 66 76    Gen: Alert, well appearing.  Patient is oriented to person, place, time, and situation. AFFECT: pleasant, lucid thought and speech. ENT:  Ears: EACs clear, normal epithelium.  TMs with good light reflex and landmarks bilaterally.  Eyes: no injection, icteris, swelling, or exudate.  EOMI, PERRLA. Nose: no drainage or turbinate edema/swelling.  No injection or focal lesion.  Mouth: lips without lesion/swelling.  Oral mucosa pink and moist.  Dentition intact and without obvious caries or gingival swelling.  Oropharynx without erythema, exudate, or swelling.  Neck: supple/nontender.  No LAD, mass, or TM.  Carotid pulses 2+ bilaterally, without bruits. CV: RRR, no m/r/g.   LUNGS: CTA bilat, nonlabored resps, good aeration in all lung fields. ABD: soft, NT, ND, BS normal.  No hepatospenomegaly or mass.  No bruits. EXT: no clubbing, cyanosis, or edema.  Musculoskeletal: no joint swelling, erythema, warmth, or tenderness.  ROM of all joints intact. Skin - no sores or suspicious lesions or rashes or color changes  Pertinent labs:  Lab Results  Component Value Date   TSH 1.31 04/30/2022   Lab Results  Component Value Date   WBC 6.1 02/11/2023   HGB 15.3 02/11/2023   HCT 49.4 02/11/2023   MCV 80.1 02/11/2023   PLT 270.0 02/11/2023   Lab Results  Component Value Date   CREATININE 1.05 02/11/2023   BUN 12 02/11/2023   NA 139 02/11/2023   K 4.5 02/11/2023   CL 101 02/11/2023   CO2 29 02/11/2023   Lab Results  Component Value Date   ALT 34 04/30/2022   AST 15 04/30/2022   ALKPHOS 40 04/30/2022   BILITOT 0.5 04/30/2022   Lab Results  Component Value Date   CHOL 261 (H) 04/30/2022   Lab Results  Component Value Date   HDL 58.10 04/30/2022   Lab Results  Component Value Date   LDLCALC 170 (H) 04/30/2022   Lab Results  Component Value Date   TRIG 165.0 (H) 04/30/2022   Lab Results  Component Value Date   CHOLHDL 4 04/30/2022   Lab Results  Component Value Date   PSA 0.26 04/30/2022   PSA 0.21 10/24/2020   PSA 0.17 09/14/2019   Lab Results  Component Value Date   HGBA1C 5.9  04/30/2022   ASSESSMENT AND  PLAN:   1 health maintenance exam: Reviewed age and gender appropriate health maintenance issues (prudent diet, regular exercise, health risks of tobacco and excessive alcohol, use of seatbelts, fire alarms in home, use of sunscreen).  Also reviewed age and gender appropriate health screening as well as vaccine recommendations. Vaccines: Tdap->declined.  Shingrix->declined..  Prevnar 20->declined. Labs: Fasting HP, Hba1c (IFG), PSA Prostate ca screening:  PSA today Colon ca screening: due for initial screening.  Options discussed--> his grandmother had colon cancer.  Recommended colonoscopy--> referred to digestive health specialist today.  2.  Hypertension, well-controlled on enalapril  20 mg twice a day.  3.  Secondary male hypogonadism. He has good efficacy from taking testosterone  injection 100 mg every 7 days. Most recent injection 5 days ago. Check testosterone  level, hemoglobin, and PSA today.  #4 obesity, class I. His BMI has dropped from 35 to 32 since getting on Retatrutide 4mg  q7d.  #5 mixed hyperlipidemia. No meds at this time. Lipid panel and hepatic panel today.  An After Visit Summary was printed and given to the patient.  FOLLOW UP:  No follow-ups on file.  Signed:  Gerlene Hockey, MD           12/17/2023

## 2023-12-18 ENCOUNTER — Encounter: Payer: Self-pay | Admitting: Family Medicine

## 2023-12-18 MED ORDER — DUTASTERIDE 0.5 MG PO CAPS: 0.5000 mg | ORAL_CAPSULE | Freq: Every day | ORAL | 1 refills | Status: DC

## 2023-12-18 NOTE — Telephone Encounter (Signed)
 Dutasteride  prescription sent

## 2023-12-23 DIAGNOSIS — Z79899 Other long term (current) drug therapy: Secondary | ICD-10-CM | POA: Diagnosis not present

## 2023-12-23 DIAGNOSIS — F902 Attention-deficit hyperactivity disorder, combined type: Secondary | ICD-10-CM | POA: Diagnosis not present

## 2024-02-03 ENCOUNTER — Other Ambulatory Visit: Payer: Self-pay | Admitting: Family Medicine

## 2024-03-16 ENCOUNTER — Other Ambulatory Visit: Payer: Self-pay | Admitting: Family Medicine

## 2024-03-16 ENCOUNTER — Telehealth: Payer: Self-pay

## 2024-03-16 ENCOUNTER — Encounter: Payer: Self-pay | Admitting: Family Medicine

## 2024-03-16 MED ORDER — TESTOSTERONE CYPIONATE 200 MG/ML IM SOLN: INTRAMUSCULAR | 1 refills | Status: AC

## 2024-03-16 NOTE — Telephone Encounter (Signed)
 Pharmacy Patient Advocate Encounter   Received notification from CoverMyMeds that prior authorization for Testosterone  Cypionate 200MG /ML intramuscular solution is required/requested.   Insurance verification completed.   The patient is insured through [facility].   Per test claim: PA required; PA submitted to above mentioned insurance via Latent Key/confirmation #/EOC [id number] Status is pending

## 2024-03-16 NOTE — Telephone Encounter (Signed)
 Hi Guy Garner. Prescription sent.

## 2024-03-19 ENCOUNTER — Other Ambulatory Visit (HOSPITAL_COMMUNITY): Payer: Self-pay

## 2024-03-19 NOTE — Telephone Encounter (Signed)
 Pharmacy Patient Advocate Encounter  Received notification from [facility] that Prior Authorization for Testosterone  Cypionate 200MG /ML intramuscular solution   has been APPROVED from 03/17/2024 to 03/17/2025. Ran test claim, Copay is $54.06 30 day supply . This test claim was processed through [facility]- copay amounts may vary at other pharmacies due to pharmacy/plan contracts, or as the patient moves through the different stages of their insurance plan.   PA #/Case ID/Reference #: [id number] LETTER OF APPROVAL HAS BEEN SCANNED IN MEDIA OF CHART.

## 2024-03-25 ENCOUNTER — Encounter: Payer: Self-pay | Admitting: Family Medicine

## 2024-03-25 ENCOUNTER — Ambulatory Visit: Payer: Self-pay

## 2024-03-25 NOTE — Telephone Encounter (Signed)
 FYI. Please see below for upcoming appt tomorrow

## 2024-03-25 NOTE — Telephone Encounter (Addendum)
*  Pt will send photos and attempted treatments via MyChart  FYI Only or Action Required?: FYI only for provider: appointment scheduled on 03/26/24.  Patient was last seen in primary care on 12/17/2023 by McGowen, Aleene DEL, MD.  Called Nurse Triage reporting Wound Check.  Symptoms began about a month ago.  Interventions attempted: Other: topical tx and bandaging.  Symptoms are: gradually worsening.  Triage Disposition: See Physician Within 24 Hours  Patient/caregiver understands and will follow disposition?: Yes  Reason for Disposition  [1] Wound > 48 hours old AND [2] becoming more painful or tender to touch  Answer Assessment - Initial Assessment Questions Pt scratched his leg approx one month ago and has become an open wound. Hx of cellulitis previously. Has been applying clobetasol and wrapping it. States not appears like an open ulcer. Denies discharge, bleeding or edema. Denies obvious necrosis.   Is currently applying neosporin and wrapping it.   1. LOCATION: Where is the wound located?      Right ankle  2. WOUND APPEARANCE: What does the wound look like?      Open ulceration  3. SIZE: If redness is present, ask: What is the size of the red area? (Inches, centimeters, or compare to size of a coin)      Pt states will send photo  5. ONSET: When did it start to look infected?      One month, worsening  6. MECHANISM: How did the wound start, what was the cause?     Scratch   8. FEVER: Do you have a fever? If Yes, ask: What is your temperature, how was it measured, and when did it start?     Denies  Protocols used: Wound Infection Suspected-A-AH  Copied from CRM #[id number]. Topic: Clinical - Red Word Triage >> Mar 25, 2024 11:44 AM Roselie BROCKS wrote: [facility] that prompted transfer to Nurse Triage: Patient states he has a very open wound on his leg, and very painful, and not healing

## 2024-03-25 NOTE — Telephone Encounter (Signed)
 FYI reviewed, pt has appt tomorrow

## 2024-03-26 ENCOUNTER — Encounter: Payer: Self-pay | Admitting: Family Medicine

## 2024-03-26 ENCOUNTER — Ambulatory Visit: Admitting: Family Medicine

## 2024-03-26 VITALS — BP 121/80 | HR 79 | Temp 99.0°F | Ht 73.0 in | Wt 230.8 lb

## 2024-03-26 DIAGNOSIS — R21 Rash and other nonspecific skin eruption: Secondary | ICD-10-CM | POA: Diagnosis not present

## 2024-03-26 DIAGNOSIS — L0100 Impetigo, unspecified: Secondary | ICD-10-CM

## 2024-03-26 MED ORDER — AMOXICILLIN-POT CLAVULANATE 875-125 MG PO TABS: 1.0000 | ORAL_TABLET | Freq: Two times a day (BID) | ORAL | 0 refills | Status: DC

## 2024-03-26 MED ORDER — MUPIROCIN 2 % EX OINT: 1.0000 | TOPICAL_OINTMENT | Freq: Three times a day (TID) | CUTANEOUS | 1 refills | Status: AC

## 2024-03-26 NOTE — Progress Notes (Signed)
 OFFICE VISIT  03/26/2024  CC:  Chief Complaint  Patient presents with   Rash    Per pt: This started a little over a month ago. Scratched my leg in my sleep with my left foot. I've Used(in the past month) Triamcinilone lotion 0.1% Clobetasol Propionate 0.05% (thought it might be eczema?); Hydrocortisone Cream-am & pm  I stopped this morning (03/25/24)because a nurse friend told me that it [facility] would slow down healing and increase risk of infection. It gets very hot and itchy after about 6-8 hours.     Patient is a 58 y.o. male who presents for right leg rash.  INTERIM HX: About 1 month ago he scratched his right ankle with his left foot while in bed. The skin opened up a little bit, itched and burned.  Gradually he has developed more little red ulcerations around it.  Applying over-the-counter and prescription steroid creams/ointments without any improvement, in fact probably worse with these. No fevers.   PMP AWARE reviewed today: most recent rx for testosterone  was filled 03/16/2024, # 6 mL, rx by me. No red flags.   Past Medical History:  Diagnosis Date   Adult ADHD    Managed by Dr. Elouise   Anxiety and depression    wellbutrin and citalopram  in past   Cellulitis and abscess of leg 03/2015   Right; I&D in ED 03/31/15 (MSSA)   Family history of bicuspid aortic valve    older brother had to get AV replacement   Hyperlipemia, mixed 07/2015; 01/2017; 09/2019   Mild (10 yr framingham risk @ 5%)-no meds indicated. Frm risk 2021 is 6%.   Hypertension 03/2015   IFG (impaired fasting glucose) 07/2015; 01/2017; 09/2019   Low 100-110.  A1c 5.5-5.7%. Upt to 5.9% June 2022 and 04/2022. A1c 6.0% Aug 2025   Insomnia    ambien in past   OSA (obstructive sleep apnea) 2021   11/02/19 home sleep study: mod-to-sev, needs CPAP   Secondary male hypogonadism    04/2022    Past Surgical History:  Procedure Laterality Date   coronary calcium score  10/2020   57th %'tile-->no statin  recommended.   HOME SLEEP STUDY  11/02/2019   Dr. Antoniette OSA->cpap recommended   Right LL venous doppler u/s Right 11/21/2018   No DVT   WISDOM TOOTH EXTRACTION      Outpatient Medications Prior to Visit  Medication Sig Dispense Refill   ALPRAZolam (XANAX) 0.5 MG tablet Take 0.5 mg by mouth daily.     amphetamine-dextroamphetamine (ADDERALL XR) 30 MG 24 hr capsule Take 30 mg by mouth daily.     aspirin EC 81 MG tablet Take 81 mg by mouth daily.     DULoxetine  (CYMBALTA ) 20 MG capsule Take 1 capsule (20 mg total) by mouth 2 (two) times daily. 180 capsule 1   dutasteride  (AVODART ) 0.5 MG capsule Take 1 capsule (0.5 mg total) by mouth daily. 90 capsule 1   enalapril  (VASOTEC ) 20 MG tablet TAKE 1 TABLET BY MOUTH TWICE A DAY 90 tablet 1   hydrOXYzine (ATARAX) 25 MG tablet Take 25 mg by mouth daily.     testosterone  cypionate (DEPOTESTOSTERONE CYPIONATE) 200 MG/ML injection INJECT 1 ML INTRAMUSCULARLY ONE TIME PER WEEK 6 mL 1   zolpidem (AMBIEN) 10 MG tablet Take 10 mg by mouth at bedtime.     Skin Protectants, Misc. (EUCERIN) cream Apply topically as needed for dry skin. (Patient not taking: Reported on 03/26/2024) 454 g 0   No facility-administered medications prior  to visit.    Allergies  Allergen Reactions   Amlodipine  Swelling    Lower legs swelling   Doxycycline  Nausea And Vomiting    Review of Systems As per HPI  PE:    03/26/2024    3:29 PM 12/17/2023    8:28 AM 02/11/2023   10:04 AM  Vitals with BMI  Height 6' 1 6' 1 6' 1  Weight 230 lbs 13 oz 247 lbs 6 oz 267 lbs 3 oz  BMI 30.46 32.65 35.26  Systolic 121 110 879  Diastolic 80 74 70  Pulse 79 66 76     Physical Exam  General: Alert and well-appearing. Right ankle with scattered small superficial ulcerations with erythematous base.  A couple of these have slight grayish film over them.  No nodularity/fluctuance. No streaking or surrounding erythema to suggest cellulitis.  LABS:  Last CBC Lab  Results  Component Value Date   WBC 5.3 12/17/2023   HGB 16.5 12/17/2023   HCT 51.9 12/17/2023   MCV 79.7 12/17/2023   RDW 18.8 (H) 12/17/2023   PLT 233.0 12/17/2023   Last metabolic panel Lab Results  Component Value Date   GLUCOSE 87 12/17/2023   NA 137 12/17/2023   K 4.9 12/17/2023   CL 101 12/17/2023   CO2 31 12/17/2023   BUN 12 12/17/2023   CREATININE 1.13 12/17/2023   GFR 71.82 12/17/2023   CALCIUM 9.8 12/17/2023   PROT 7.4 12/17/2023   ALBUMIN 4.9 12/17/2023   BILITOT 0.6 12/17/2023   ALKPHOS 39 12/17/2023   AST 23 12/17/2023   ALT 43 12/17/2023   Last lipids Lab Results  Component Value Date   CHOL 168 12/17/2023   HDL 42.90 12/17/2023   LDLCALC 105 (H) 12/17/2023   LDLDIRECT 154.0 10/24/2020   TRIG 101.0 12/17/2023   CHOLHDL 4 12/17/2023   Last hemoglobin A1c Lab Results  Component Value Date   HGBA1C 6.0 12/17/2023   Last thyroid  functions Lab Results  Component Value Date   TSH 1.36 12/17/2023   IMPRESSION AND PLAN:  Left ankle impetigo. He has had MSSA multiple times on his lower legs in the past. Will treat with Augmentin  875 twice daily x 10 days.  Bactroban  ointment 3 times daily until resolved. Wound care instructions given.  An After Visit Summary was printed and given to the patient.  FOLLOW UP: Return if symptoms worsen or fail to improve.  Signed:  Gerlene Hockey, MD           03/26/2024

## 2024-03-30 ENCOUNTER — Encounter: Payer: Self-pay | Admitting: Family Medicine

## 2024-03-31 ENCOUNTER — Encounter: Payer: Self-pay | Admitting: Sports Medicine

## 2024-03-31 ENCOUNTER — Ambulatory Visit: Payer: Self-pay

## 2024-03-31 ENCOUNTER — Ambulatory Visit: Admitting: Sports Medicine

## 2024-03-31 VITALS — BP 123/76 | HR 73 | Temp 97.9°F | Wt 234.0 lb

## 2024-03-31 DIAGNOSIS — L03115 Cellulitis of right lower limb: Secondary | ICD-10-CM

## 2024-03-31 LAB — CBC WITH DIFFERENTIAL/PLATELET
Basophils Absolute: 0 K/uL (ref 0.0–0.1)
Basophils Relative: 0.8 % (ref 0.0–3.0)
Eosinophils Absolute: 0.2 K/uL (ref 0.0–0.7)
Eosinophils Relative: 3.1 % (ref 0.0–5.0)
HCT: 47.3 % (ref 39.0–52.0)
Hemoglobin: 15.4 g/dL (ref 13.0–17.0)
Lymphocytes Relative: 34.2 % (ref 12.0–46.0)
Lymphs Abs: 1.7 K/uL (ref 0.7–4.0)
MCHC: 32.5 g/dL (ref 30.0–36.0)
MCV: 81.4 fl (ref 78.0–100.0)
Monocytes Absolute: 0.4 K/uL (ref 0.1–1.0)
Monocytes Relative: 8.9 % (ref 3.0–12.0)
Neutro Abs: 2.6 K/uL (ref 1.4–7.7)
Neutrophils Relative %: 53 % (ref 43.0–77.0)
Platelets: 224 K/uL (ref 150.0–400.0)
RBC: 5.81 Mil/uL (ref 4.22–5.81)
RDW: 17.2 % — ABNORMAL HIGH (ref 11.5–15.5)
WBC: 4.9 K/uL (ref 4.0–10.5)

## 2024-03-31 LAB — BASIC METABOLIC PANEL WITH GFR
BUN: 14 mg/dL (ref 6–23)
CO2: 31 meq/L (ref 19–32)
Calcium: 9.9 mg/dL (ref 8.4–10.5)
Chloride: 102 meq/L (ref 96–112)
Creatinine, Ser: 1.04 mg/dL (ref 0.40–1.50)
GFR: 79.18 mL/min (ref >60.00)
Glucose, Bld: 68 mg/dL — ABNORMAL LOW (ref 70–99)
Potassium: 4.6 meq/L (ref 3.5–5.1)
Sodium: 139 meq/L (ref 135–145)

## 2024-03-31 MED ORDER — SULFAMETHOXAZOLE-TRIMETHOPRIM 800-160 MG PO TABS: 1.0000 | ORAL_TABLET | Freq: Two times a day (BID) | ORAL | 0 refills | Status: DC

## 2024-03-31 MED ORDER — TERBINAFINE HCL 250 MG PO TABS: ORAL_TABLET | ORAL | 2 refills | Status: AC

## 2024-03-31 NOTE — Telephone Encounter (Signed)
 No further action needed at this time.

## 2024-03-31 NOTE — Addendum Note (Signed)
 Addended by: CANDISE ALEENE DEL on: 03/31/2024 07:35 PM   Modules accepted: Orders

## 2024-03-31 NOTE — Progress Notes (Signed)
 Careteam: Patient Care Team: Candise Aleene DEL, MD as PCP - General (Family Medicine) Elouise No, DO as Consulting Physician (Internal Medicine) Ivin Kocher, MD as Consulting Physician (Dermatology) Dohmeier, Dedra, MD as Consulting Physician (Neurology)  Allergies  Allergen Reactions   Amlodipine  Swelling    Lower legs swelling   Doxycycline  Nausea And Vomiting    Chief Complaint  Patient presents with   Wound Infection    Pt was seen by Dr Candise recently for a scratch on the back of his leg. He thinks it may be infected now. He has been having burning and throbbing.    Discussed the use of AI scribe software for clinical note transcription with the patient, who gave verbal consent to proceed.  History of Present Illness  Guy Garner is a 58 year old male who presents with worsening wound on his Rt leg.  The current skin infection began approximately five weeks ago following a scratch on his leg. He has a history of cellulitis, previously treated with Keflex  for 90 days in 2016. Initially, he applied triple antibiotic ointment, which seemed to help, but the condition flared up again. Subsequent use of Epsom salt soaks and a steroid cream worsened the condition, causing it to 'explode'.  He has been using mupirocin  ointment three times a day with bandage changes and taking 875 mg of ampicillin twice a day for the past week. Despite this, he reports persistent burning and sensitivity to touch. The sensation is described as 'going deep' rather than spreading like previous cellulitis episodes. He reports increased pain and redness.  His occupation as a hairstylist requires standing for long periods, exacerbating the discomfort. He wraps the wound at night but finds it more comfortable uncovered due to pressure and burning. He has been taking Tylenol  and ibuprofen  every five hours for pain management.   He is taking precautions with his antibiotic regimen, consuming  probiotics like yogurt to mitigate gastrointestinal side effects. He reports mild diarrhea and burning during bowel movements but no severe symptoms yet.  He is concerned about his upcoming travel plans with the wound on his leg.    Review of Systems:  Review of Systems  Constitutional:  Negative for chills and fever.  HENT:  Negative for congestion and sore throat.   Respiratory:  Negative for cough, sputum production and shortness of breath.   Cardiovascular:  Negative for chest pain, palpitations and leg swelling.  Gastrointestinal:  Negative for abdominal pain, heartburn and nausea.  Genitourinary:  Negative for dysuria, frequency and hematuria.  Musculoskeletal:  Negative for falls and myalgias.  Skin:        Worsening wound on his Rt leg  Neurological:  Negative for dizziness, sensory change and focal weakness.   Negative unless indicated in HPI.   Patient Active Problem List   Diagnosis Date Noted   Thumb injury, initial encounter 06/27/2020   Excessive daytime sleepiness 09/28/2019   GERD with apnea 09/28/2019   Snoring 09/28/2019   Obesity (BMI 30.0-34.9) 04/21/2018   Anxiety and depression    Hypertension 03/15/2015   Past Medical History:  Diagnosis Date   Adult ADHD    Managed by Dr. Elouise   Anxiety and depression    wellbutrin and citalopram  in past   Cellulitis and abscess of leg 03/2015   Right; I&D in ED 03/31/15 (MSSA)   Family history of bicuspid aortic valve    older brother had to get AV replacement   Hyperlipemia, mixed 07/2015; 01/2017; 09/2019  Mild (10 yr framingham risk @ 5%)-no meds indicated. Frm risk 2021 is 6%.   Hypertension 03/2015   IFG (impaired fasting glucose) 07/2015; 01/2017; 09/2019   Low 100-110.  A1c 5.5-5.7%. Upt to 5.9% June 2022 and 04/2022. 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   HOME SLEEP STUDY  11/02/2019   Dr. Antoniette OSA->cpap recommended   Right LL venous doppler u/s Right 11/21/2018   No DVT   WISDOM TOOTH EXTRACTION     Social History   Tobacco Use   Smoking status: Never   Smokeless tobacco: Never  Vaping Use   Vaping status: Never Used  Substance Use Topics   Alcohol use: Yes    Comment: occ   Drug use: No   Family History  Problem Relation Age of Onset   Hypothyroidism Mother    Dementia Mother    Heart attack Father    Diabetes Father    Hyperlipidemia Father    Heart disease Father    Hypertension Father    Diabetes Brother    Stroke Brother    Diabetes Maternal Grandmother    Arthritis Paternal Grandmother    Colon cancer Paternal Grandmother        dx mid-70's   Cancer Paternal Grandmother    Stroke Paternal Grandfather    Allergies  Allergen Reactions   Amlodipine  Swelling    Lower legs swelling   Doxycycline  Nausea And Vomiting    Medications: Patient's Medications  New Prescriptions   SULFAMETHOXAZOLE-TRIMETHOPRIM (BACTRIM DS) 800-160 MG TABLET    Take 1 tablet by mouth 2 (two) times daily for 7 days.  Previous Medications   ALPRAZOLAM (XANAX) 0.5 MG TABLET    Take 0.5 mg by mouth daily.   AMOXICILLIN -CLAVULANATE (AUGMENTIN ) 875-125 MG TABLET    Take 1 tablet by mouth 2 (two) times daily.   AMPHETAMINE-DEXTROAMPHETAMINE (ADDERALL XR) 30 MG 24 HR CAPSULE    Take 30 mg by mouth daily.   ASPIRIN EC 81 MG TABLET    Take 81 mg by mouth daily.   DULOXETINE  (CYMBALTA ) 20 MG CAPSULE    Take 1 capsule (20 mg total) by mouth 2 (two) times daily.   DUTASTERIDE  (AVODART ) 0.5 MG CAPSULE    Take 1 capsule (0.5 mg total) by mouth daily.   ENALAPRIL  (VASOTEC ) 20 MG TABLET    TAKE 1 TABLET BY MOUTH TWICE A DAY   HYDROXYZINE (ATARAX) 25 MG TABLET    Take 25 mg by mouth daily.   MUPIROCIN  OINTMENT  (BACTROBAN ) 2 %    Apply 1 Application topically 3 (three) times daily.   TESTOSTERONE  CYPIONATE (DEPOTESTOSTERONE CYPIONATE) 200 MG/ML INJECTION    INJECT 1 ML INTRAMUSCULARLY ONE TIME PER WEEK   ZOLPIDEM (AMBIEN) 10 MG TABLET    Take 10 mg by mouth at bedtime.  Modified Medications   No medications on file  Discontinued Medications   No medications on file    Physical Exam: Vitals:   03/31/24 1007  BP: 123/76  Pulse: 73  Temp: 97.9 F (36.6 C)  TempSrc: Oral  SpO2: 97%  Weight: 234 lb (106.1 kg)   Body mass index is 30.87 kg/m. BP Readings from Last 3  Encounters:  03/31/24 123/76  03/26/24 121/80  12/17/23 110/74   Wt Readings from Last 3 Encounters:  03/31/24 234 lb (106.1 kg)  03/26/24 230 lb 12.8 oz (104.7 kg)  12/17/23 247 lb 6.4 oz (112.2 kg)    Physical Exam Constitutional:      Appearance: Normal appearance.  HENT:     Head: Normocephalic and atraumatic.  Cardiovascular:     Rate and Rhythm: Normal rate and regular rhythm.     Pulses: Normal pulses.     Heart sounds: Normal heart sounds.  Pulmonary:     Effort: No respiratory distress.     Breath sounds: No stridor. No wheezing or rales.  Abdominal:     General: Bowel sounds are normal. There is no distension.     Palpations: Abdomen is soft.     Tenderness: There is no abdominal tenderness. There is no right CVA tenderness or guarding.  Musculoskeletal:        General: No swelling.  Skin:    Comments: 4x3 cm wound on the lower Rt leg  Erythema with drainage noted  Warmth + Mild tenderness to palpation  Neurological:     Mental Status: He is alert. Mental status is at baseline.     Sensory: No sensory deficit.     Motor: No weakness.     Labs reviewed: Basic Metabolic Panel: Recent Labs    12/17/23 0902  NA 137  K 4.9  CL 101  CO2 31  GLUCOSE 87  BUN 12  CREATININE 1.13  CALCIUM 9.8  TSH 1.36   Liver Function Tests: Recent Labs    12/17/23 0902  AST 23  ALT 43  ALKPHOS 39   BILITOT 0.6  PROT 7.4  ALBUMIN 4.9   No results for input(s): LIPASE, AMYLASE in the last 8760 hours. No results for input(s): AMMONIA in the last 8760 hours. CBC: Recent Labs    12/17/23 0902  WBC 5.3  NEUTROABS 2.7  HGB 16.5  HCT 51.9  MCV 79.7  PLT 233.0   Lipid Panel: Recent Labs    12/17/23 0902  CHOL 168  HDL 42.90  LDLCALC 105*  TRIG 101.0  CHOLHDL 4   TSH: Recent Labs    12/17/23 0902  TSH 1.36   A1C: Lab Results  Component Value Date   HGBA1C 6.0 12/17/2023    Assessment & Plan Cellulitis of right lower extremity Pt with worsening wound on his rt leg Duration - 5 weeks  Currently on mupirocin  and augmentin   Denies fevers, chills Reports worsening redness, pain, itching Will get labs cbc, bmp Stop mupirocin   Will switch to bactrim  Follow up in clinic if no improvement Orders:   CBC w/Diff   Basic Metabolic Panel (BMET)   sulfamethoxazole-trimethoprim (BACTRIM DS) 800-160 MG tablet; Take 1 tablet by mouth 2 (two) times daily for 7 days.   Return follow up with  pcp.:   Konstantina Nachreiner

## 2024-03-31 NOTE — Telephone Encounter (Signed)
 CMA spoke with pt, he would prefer not to do a punch biopsy prior to his trip next week. Pt unavailable to come in during offered times this week.  Please further advise on next steps

## 2024-03-31 NOTE — Telephone Encounter (Signed)
 FYI Only or Action Required?: FYI only for provider: appointment scheduled on 03/31/2024.  Patient was last seen in primary care on 03/26/2024 by McGowen, Aleene DEL, MD.  Called Nurse Triage reporting Wound Infection.  Symptoms began a week ago.  Interventions attempted: Prescription medications: Augmentin .  Symptoms are: gradually worsening.  Triage Disposition: See Physician Within 24 Hours  Patient/caregiver understands and will follow disposition?: Yes        Copied from CRM #[id number]. Topic: Clinical - Red Word Triage >> Mar 31, 2024  8:05 AM Rosina BIRCH wrote: Red Word that prompted transfer to Nurse Triage: patient called stating he saw the provider for a leg wound on his ankle on 11/13. He was given antibiotics but he does not know if it is helping or not because it is worse than it was. Patient states it looks like it is eating the skin like it is tunneling in. Reason for Disposition  [1] Taking antibiotic > 24 hours AND [2] wound infection symptoms are WORSE (e.g., draining pus, pain, red streak, spreading redness, swelling)  Answer Assessment - Initial Assessment Questions 1. SYMPTOM: What's the main symptom you're concerned about? (e.g., redness, swelling, pain, fever, weakness)     Skin looks like it is tunneling in  2. WOUND INFECTION LOCATION: Where is the wound infection located? (e.g., arm, face, foot, knee, leg)     Ankle area  3. WOUND INFECTION SIZE: What is the size of the red area? (e.g., inches, centimeters; compare to size of a coin)      He states the wound looks like it is getting deeper  4. BETTER-SAME-WORSE: Are you getting better, staying the same, or getting worse compared to the day you started the antibiotics?      Getting worse  5. PAIN: Do you have any pain?  If Yes, ask: How bad is the pain?  (e.g., Scale 1-10; mild, moderate, or severe)     Moderate  6. FEVER: Do you have a fever? If Yes, ask: What is it, how was it measured and  when did it start?     Denies  7. OTHER SYMPTOMS: Do you have any other symptoms? (e.g., pus coming from a wound, red streaks, weakness)     Denies  8. DIAGNOSIS DATE: When was the wound infection diagnosed? By whom?      03/26/2024 Dr. Candise  9. ANTIBIOTIC NAME: What antibiotic(s) are you taking?  How many times a day? Note: Be sure the patient is taking the antibiotic as directed.      Augmentin   10. ANTIBIOTIC DATE: When was the antibiotic started?       03/26/2024 11. FOLLOW-UP APPOINTMENT: Do you have a follow-up appointment with your doctor?       No  Protocols used: Wound Infection on Antibiotic Follow-up Call-[facility]

## 2024-03-31 NOTE — Telephone Encounter (Signed)
Pt already seen in office.  

## 2024-03-31 NOTE — Telephone Encounter (Signed)
 Tried calling pt, unable to LVM. Phone continuously rang with no answer.

## 2024-03-31 NOTE — Telephone Encounter (Signed)
 Hi Damante, I agree it looks worse. It is not acting/looking like a typical skin infection, though. I want to continue the current antibiotic and I would like you to come back and I will culture it and take a biopsy of it.  Millicent, can you help arrange an appointment for him this week?

## 2024-03-31 NOTE — Telephone Encounter (Signed)
 Please see message below

## 2024-03-31 NOTE — Telephone Encounter (Signed)
 Hi Guy Garner I did not realize you saw Dr. LULLA this morning. I agree with stopping the amoxicillin /clavulanic acid that I prescribed you and starting the Bactrim that she prescribed. It is okay to take this with the enalapril . Okay to leave off the Bactroban  ointment. I am not totally convinced this is infection because it looks so atypical. I would like you to come back in this week if at all possible so I can examine it, do culture, and possibly even do a punch biopsy. Sorry!   Millicent, please help him arrange appointment at his convenience this week.  If I do not have an open spot that is convenient for his schedule then go ahead and work him in anytime.

## 2024-03-31 NOTE — Telephone Encounter (Signed)
 FYI [name]:  I called Guy Garner and notified him of my latest recommendations: I believe his leg has fungal infection, not bacterial.  I advised him to stop all antibiotic pills and creams/ointments.  I sent in a rx for lamisil 250mg  daily and he'll likely need to take it 3 months. I also recommended he try to keep the area cool and dry. If he has to occlude it he should apply a thin film of otc lamisil and cover with dry gauze or bandaid.  He'll send mychart update.

## 2024-04-01 ENCOUNTER — Ambulatory Visit: Payer: Self-pay | Admitting: Sports Medicine

## 2024-04-01 NOTE — Progress Notes (Signed)
Results sent in mychart

## 2024-04-01 NOTE — Telephone Encounter (Signed)
 No further action needed.

## 2024-04-02 ENCOUNTER — Encounter: Payer: Self-pay | Admitting: Family Medicine

## 2024-04-07 ENCOUNTER — Ambulatory Visit: Admitting: Family Medicine

## 2024-04-07 NOTE — Telephone Encounter (Signed)
 Yes it will be a slow /gradual process. Stay the course. Have a great holiday!

## 2024-04-26 ENCOUNTER — Encounter: Payer: Self-pay | Admitting: Family Medicine

## 2024-04-30 NOTE — Telephone Encounter (Signed)
 No further action needed.

## 2024-05-09 ENCOUNTER — Other Ambulatory Visit: Payer: Self-pay | Admitting: Family Medicine

## 2024-05-15 ENCOUNTER — Encounter: Payer: Self-pay | Admitting: Family Medicine

## 2024-05-18 ENCOUNTER — Other Ambulatory Visit: Payer: Self-pay

## 2024-05-18 MED ORDER — ENALAPRIL MALEATE 20 MG PO TABS: 20.0000 mg | ORAL_TABLET | Freq: Two times a day (BID) | ORAL | 1 refills | Status: AC

## 2024-05-18 NOTE — Telephone Encounter (Signed)
"  No further action needed at this time.  "

## 2024-06-09 ENCOUNTER — Other Ambulatory Visit: Payer: Self-pay | Admitting: Family Medicine

## 2024-06-22 ENCOUNTER — Ambulatory Visit: Admitting: Family Medicine
